# Patient Record
Sex: Female | Born: 1950
Health system: Southern US, Community
[De-identification: ages and names within clinical notes are randomized; demographics above are authoritative.]

## PROBLEM LIST (undated history)

## (undated) DIAGNOSIS — D45 Polycythemia vera: Secondary | ICD-10-CM

## (undated) DIAGNOSIS — I999 Unspecified disorder of circulatory system: Secondary | ICD-10-CM

## (undated) DIAGNOSIS — E785 Hyperlipidemia, unspecified: Secondary | ICD-10-CM

## (undated) DIAGNOSIS — F329 Major depressive disorder, single episode, unspecified: Secondary | ICD-10-CM

## (undated) DIAGNOSIS — D696 Thrombocytopenia, unspecified: Secondary | ICD-10-CM

## (undated) DIAGNOSIS — I10 Essential (primary) hypertension: Secondary | ICD-10-CM

## (undated) DIAGNOSIS — F32A Depression, unspecified: Secondary | ICD-10-CM

## (undated) DIAGNOSIS — I739 Peripheral vascular disease, unspecified: Secondary | ICD-10-CM

## (undated) DIAGNOSIS — F5101 Primary insomnia: Secondary | ICD-10-CM

## (undated) DIAGNOSIS — F419 Anxiety disorder, unspecified: Secondary | ICD-10-CM

## (undated) DIAGNOSIS — D0359 Melanoma in situ of other part of trunk: Secondary | ICD-10-CM

## (undated) DIAGNOSIS — J101 Influenza due to other identified influenza virus with other respiratory manifestations: Secondary | ICD-10-CM

## (undated) DIAGNOSIS — F1010 Alcohol abuse, uncomplicated: Secondary | ICD-10-CM

## (undated) DIAGNOSIS — J449 Chronic obstructive pulmonary disease, unspecified: Secondary | ICD-10-CM

## (undated) DIAGNOSIS — I779 Disorder of arteries and arterioles, unspecified: Secondary | ICD-10-CM

## (undated) DIAGNOSIS — J9611 Chronic respiratory failure with hypoxia: Secondary | ICD-10-CM

## (undated) HISTORY — DX: Influenza due to other identified influenza virus with other respiratory manifestations: J10.1

## (undated) HISTORY — PX: TONSILLECTOMY: SUR1361

## (undated) HISTORY — DX: Polycythemia vera: D45

## (undated) HISTORY — DX: Primary insomnia: F51.01

## (undated) HISTORY — DX: Disorder of arteries and arterioles, unspecified: I77.9

## (undated) HISTORY — DX: Thrombocytopenia, unspecified: D69.6

## (undated) HISTORY — PX: ABDOMINAL HYSTERECTOMY: SHX81

## (undated) HISTORY — DX: Major depressive disorder, single episode, unspecified: F32.9

## (undated) HISTORY — DX: Chronic respiratory failure with hypoxia: J96.11

## (undated) HISTORY — PX: OTHER SURGICAL HISTORY: SHX169

## (undated) HISTORY — DX: Melanoma in situ of other part of trunk: D03.59

## (undated) HISTORY — DX: Unspecified disorder of circulatory system: I99.9

## (undated) HISTORY — DX: Peripheral vascular disease, unspecified: I73.9

## (undated) HISTORY — DX: Chronic obstructive pulmonary disease, unspecified: J44.9

## (undated) HISTORY — DX: Hyperlipidemia, unspecified: E78.5

## (undated) HISTORY — DX: Anxiety disorder, unspecified: F41.9

## (undated) HISTORY — DX: Alcohol abuse, uncomplicated: F10.10

## (undated) HISTORY — DX: Depression, unspecified: F32.A

## (undated) HISTORY — DX: Essential (primary) hypertension: I10

---

## 2013-12-04 DIAGNOSIS — F329 Major depressive disorder, single episode, unspecified: Secondary | ICD-10-CM | POA: Insufficient documentation

## 2013-12-04 DIAGNOSIS — F32A Depression, unspecified: Secondary | ICD-10-CM | POA: Insufficient documentation

## 2013-12-04 DIAGNOSIS — F1020 Alcohol dependence, uncomplicated: Secondary | ICD-10-CM | POA: Insufficient documentation

## 2013-12-04 DIAGNOSIS — D696 Thrombocytopenia, unspecified: Secondary | ICD-10-CM | POA: Insufficient documentation

## 2013-12-04 DIAGNOSIS — F17201 Nicotine dependence, unspecified, in remission: Secondary | ICD-10-CM | POA: Insufficient documentation

## 2015-07-08 ENCOUNTER — Other Ambulatory Visit: Payer: Self-pay | Admitting: Internal Medicine

## 2015-07-08 ENCOUNTER — Encounter: Payer: Self-pay | Admitting: Internal Medicine

## 2015-07-08 DIAGNOSIS — F411 Generalized anxiety disorder: Secondary | ICD-10-CM | POA: Insufficient documentation

## 2015-07-08 DIAGNOSIS — J439 Emphysema, unspecified: Secondary | ICD-10-CM | POA: Insufficient documentation

## 2015-07-08 DIAGNOSIS — E559 Vitamin D deficiency, unspecified: Secondary | ICD-10-CM | POA: Insufficient documentation

## 2015-07-08 DIAGNOSIS — F5101 Primary insomnia: Secondary | ICD-10-CM | POA: Insufficient documentation

## 2015-07-08 DIAGNOSIS — E785 Hyperlipidemia, unspecified: Secondary | ICD-10-CM | POA: Insufficient documentation

## 2015-07-08 DIAGNOSIS — F324 Major depressive disorder, single episode, in partial remission: Secondary | ICD-10-CM | POA: Insufficient documentation

## 2015-07-08 DIAGNOSIS — I739 Peripheral vascular disease, unspecified: Secondary | ICD-10-CM | POA: Insufficient documentation

## 2015-07-08 DIAGNOSIS — D45 Polycythemia vera: Secondary | ICD-10-CM | POA: Insufficient documentation

## 2015-07-08 DIAGNOSIS — I10 Essential (primary) hypertension: Secondary | ICD-10-CM | POA: Insufficient documentation

## 2015-07-08 DIAGNOSIS — F1010 Alcohol abuse, uncomplicated: Secondary | ICD-10-CM | POA: Insufficient documentation

## 2015-07-08 DIAGNOSIS — R739 Hyperglycemia, unspecified: Secondary | ICD-10-CM | POA: Insufficient documentation

## 2015-07-08 DIAGNOSIS — Z85820 Personal history of malignant melanoma of skin: Secondary | ICD-10-CM | POA: Insufficient documentation

## 2015-07-10 DIAGNOSIS — E871 Hypo-osmolality and hyponatremia: Secondary | ICD-10-CM | POA: Insufficient documentation

## 2015-07-10 DIAGNOSIS — J9611 Chronic respiratory failure with hypoxia: Secondary | ICD-10-CM | POA: Insufficient documentation

## 2015-08-07 ENCOUNTER — Other Ambulatory Visit: Payer: Self-pay | Admitting: Internal Medicine

## 2015-08-07 ENCOUNTER — Ambulatory Visit: Payer: Self-pay | Admitting: Internal Medicine

## 2015-08-07 DIAGNOSIS — I779 Disorder of arteries and arterioles, unspecified: Secondary | ICD-10-CM | POA: Insufficient documentation

## 2015-08-07 DIAGNOSIS — Z8669 Personal history of other diseases of the nervous system and sense organs: Secondary | ICD-10-CM

## 2015-08-09 ENCOUNTER — Encounter: Payer: Self-pay | Admitting: Internal Medicine

## 2015-08-09 ENCOUNTER — Other Ambulatory Visit: Payer: Self-pay | Admitting: Internal Medicine

## 2015-08-09 ENCOUNTER — Ambulatory Visit (INDEPENDENT_AMBULATORY_CARE_PROVIDER_SITE_OTHER): Payer: Self-pay | Admitting: Internal Medicine

## 2015-08-09 VITALS — BP 168/82 | HR 80 | Ht 65.0 in | Wt 145.6 lb

## 2015-08-09 DIAGNOSIS — E785 Hyperlipidemia, unspecified: Secondary | ICD-10-CM

## 2015-08-09 DIAGNOSIS — D45 Polycythemia vera: Secondary | ICD-10-CM

## 2015-08-09 DIAGNOSIS — I739 Peripheral vascular disease, unspecified: Secondary | ICD-10-CM

## 2015-08-09 DIAGNOSIS — Z8669 Personal history of other diseases of the nervous system and sense organs: Secondary | ICD-10-CM

## 2015-08-09 DIAGNOSIS — R0989 Other specified symptoms and signs involving the circulatory and respiratory systems: Secondary | ICD-10-CM

## 2015-08-09 MED ORDER — ATORVASTATIN CALCIUM 10 MG PO TABS
10.0000 mg | ORAL_TABLET | Freq: Every day | ORAL | Status: DC
Start: 2015-08-09 — End: 2015-08-24

## 2015-08-09 NOTE — Progress Notes (Signed)
Date:  08/09/2015   Name:  Sandra Daniel   DOB:  07-05-1951   MRN:  [id number]   Chief Complaint: Eye Problem  Patient presents to follow-up from vision changes. Of note she complained of transient vision changes as long ago as 6 months. She was evaluated by hematology for polycythemia vera. She reported the vision change at that time and they suggested she might benefit from therapeutic phlebotomy. Most recently she was seen by ophthalmology and they did an evaluation and recommended that she follow-up with me for further testing for Amaurosis Fugax. She had a lipid panel 6 months ago which showed an HDL of 92. 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 She has gained some weight and does continue to drink beer on a daily basis. She is currently without insurance coverage but will be getting Medicare in March.   Review of Systems  Constitutional: Positive for unexpected weight change. Negative for chills and diaphoresis.  HENT: Negative for hearing loss.   Eyes: Positive for visual disturbance (none in several months).  Respiratory: Positive for shortness of breath (improving since quitting cigarettes). Negative for cough and wheezing.   Cardiovascular: Negative for chest pain, palpitations and leg swelling.  Gastrointestinal: Negative for abdominal pain.  Musculoskeletal: Positive for gait problem (leg discomfort and heaviness with exertion).  Skin: Positive for color change.  Neurological: Negative for tremors, syncope, light-headedness and headaches.  Psychiatric/Behavioral: Negative for sleep disturbance and dysphoric mood.    Patient Active Problem List   Diagnosis Date Noted  . Peripheral arterial occlusive disease ([place]) 08/07/2015  . H/O amaurosis fugax 08/07/2015  . Below normal amount of sodium in the blood 07/10/2015  . Chronic respiratory failure with hypoxia ([place]) 07/10/2015  . AA (alcohol abuse) 07/08/2015  . Anxiety attack 07/08/2015  . Essential (primary)  hypertension 07/08/2015  . Blood glucose elevated 07/08/2015  . HLD (hyperlipidemia) 07/08/2015  . Depression, major, single episode, in partial remission ([place]) 07/08/2015  . H/O Malignant melanoma 07/08/2015  . Vascular disorder of extremity ([place]) 07/08/2015  . Polycythemia vera ([place]) 07/08/2015  . Idiopathic insomnia 07/08/2015  . Chronic obstructive pulmonary emphysema ([place]) 07/08/2015  . Avitaminosis D 07/08/2015  . Thrombocytopenia ([place]) 12/04/2013    Prior to Admission medications   Medication Sig Start Date End Date Taking? Authorizing Provider  albuterol (PROAIR HFA) 108 (90 BASE) MCG/ACT inhaler Inhale 2 puffs into the lungs 4 (four) times daily as needed.   Yes Historical Provider, MD  ALPRAZolam Duanne Moron) 0.5 MG tablet Take 1 tablet by mouth 2 (two) times daily as needed. 11/21/14  Yes Historical Provider, MD  amLODipine-benazepril (LOTREL) 5-10 MG per capsule 1 CAPSULE BY MOUTH DAILY 07/08/15  Yes Sandra Hess, MD  budesonide-formoterol [facility]) 160-4.5 MCG/ACT inhaler Inhale 2 puffs into the lungs 2 (two) times daily. 07/12/15 07/11/16 Yes Historical Provider, MD  metoprolol succinate (TOPROL-XL) 50 MG 24 hr tablet Take 1 tablet by mouth daily. 11/21/14  Yes Historical Provider, MD  mirtazapine (REMERON) 15 MG tablet Take 1 tablet by mouth at bedtime. 11/21/14  Yes Historical Provider, MD  PARoxetine (PAXIL) 30 MG tablet Take 1 tablet by mouth daily. 11/21/14  Yes Historical Provider, MD    Allergies  Allergen Reactions  . Lorazepam Anxiety    Past Surgical History  Procedure Laterality Date  . Abdominal hysterectomy    . Tonsillectomy    . Lumbar back surgery      Social History  Substance Use Topics  . Smoking status:  Former Smoker    Quit date: 07/08/2015  . Smokeless tobacco: None  . Alcohol Use: 0.0 oz/week    0 Standard drinks or equivalent per week     Comment: 12 beers per day     Medication list has been reviewed and updated.  Physical  Examination:  Physical Exam  Constitutional: She appears well-developed and well-nourished.  Neck: Neck supple. Carotid bruit is present. No thyroid mass and no thyromegaly present.    Cardiovascular: Normal rate, regular rhythm and normal heart sounds.   Pulses:      Posterior tibial pulses are 1+ on the right side, and 1+ on the left side.  Pulmonary/Chest: Effort normal. She has decreased breath sounds. She has no wheezes. She has no rhonchi.  Lymphadenopathy:    She has no cervical adenopathy.  Psychiatric: She has a normal mood and affect.  Nursing note and vitals reviewed.   BP 168/82 mmHg  Pulse 80  Ht 5\' 5"  (1.651 m)  Wt 145 lb 9.6 oz (66.044 kg)  BMI 24.23 kg/m2  Assessment and Plan: 1. Right carotid bruit Begin atorvastatin We will order carotid ultrasound in March after patient obtains Medicare coverage - atorvastatin (LIPITOR) 10 MG tablet; Take 1 tablet (10 mg total) by mouth daily.  Dispense: 30 tablet; Refill: 5  2. HLD (hyperlipidemia) - atorvastatin (LIPITOR) 10 MG tablet; Take 1 tablet (10 mg total) by mouth daily.  Dispense: 30 tablet; Refill: 5  3. H/O amaurosis fugax Suspect symptoms are secondary to polycythemia vera If symptoms recur I recommend follow-up with hematology for therapeutic phlebotomy Nonfasting blood sugar 121  4. Polycythemia vera ([place])   5. Vascular disorder of extremity ([place]) Lipitor disorder may improve with ongoing smoking cessation Reevaluate at follow-up for possible referral   Sandra Maidens, MD [facility]  08/09/2015

## 2015-08-23 ENCOUNTER — Telehealth: Payer: Self-pay

## 2015-08-23 NOTE — Telephone Encounter (Signed)
Patient called, Atorvastatin making her legs weak, muscle cramps, can't walk to mailbox anymore, is going to stop taking it. Just wanted you to know and see if you wanted her to take anything else.dr

## 2015-08-24 MED ORDER — PRAVASTATIN SODIUM 20 MG PO TABS: 20.0000 mg | ORAL_TABLET | Freq: Every day | ORAL | Status: DC

## 2015-08-24 NOTE — Telephone Encounter (Signed)
It is important that we find a cholesterol lowering medication that she can take.  We will try pravachol 20 mg per day.  Rx sent to pharmacy.

## 2015-08-26 NOTE — Telephone Encounter (Signed)
Patient informed.dr 

## 2015-11-26 ENCOUNTER — Ambulatory Visit: Payer: Self-pay | Admitting: Internal Medicine

## 2015-12-02 ENCOUNTER — Other Ambulatory Visit: Payer: Self-pay | Admitting: Internal Medicine

## 2016-01-09 ENCOUNTER — Encounter: Payer: Self-pay | Admitting: Family Medicine

## 2016-01-17 DIAGNOSIS — J101 Influenza due to other identified influenza virus with other respiratory manifestations: Secondary | ICD-10-CM

## 2016-01-17 HISTORY — DX: Influenza due to other identified influenza virus with other respiratory manifestations: J10.1

## 2016-01-20 DIAGNOSIS — I10 Essential (primary) hypertension: Secondary | ICD-10-CM | POA: Diagnosis not present

## 2016-01-20 DIAGNOSIS — J9600 Acute respiratory failure, unspecified whether with hypoxia or hypercapnia: Secondary | ICD-10-CM | POA: Insufficient documentation

## 2016-01-20 DIAGNOSIS — R05 Cough: Secondary | ICD-10-CM | POA: Diagnosis not present

## 2016-01-20 DIAGNOSIS — J101 Influenza due to other identified influenza virus with other respiratory manifestations: Secondary | ICD-10-CM | POA: Insufficient documentation

## 2016-01-20 DIAGNOSIS — J441 Chronic obstructive pulmonary disease with (acute) exacerbation: Secondary | ICD-10-CM | POA: Diagnosis not present

## 2016-01-20 DIAGNOSIS — R6889 Other general symptoms and signs: Secondary | ICD-10-CM | POA: Diagnosis not present

## 2016-01-20 DIAGNOSIS — R0600 Dyspnea, unspecified: Secondary | ICD-10-CM | POA: Diagnosis not present

## 2016-01-20 DIAGNOSIS — R0602 Shortness of breath: Secondary | ICD-10-CM | POA: Diagnosis not present

## 2016-01-30 ENCOUNTER — Ambulatory Visit (INDEPENDENT_AMBULATORY_CARE_PROVIDER_SITE_OTHER): Payer: Medicare Other | Admitting: Internal Medicine

## 2016-01-30 ENCOUNTER — Encounter: Payer: Self-pay | Admitting: Internal Medicine

## 2016-01-30 ENCOUNTER — Other Ambulatory Visit: Payer: Self-pay | Admitting: Internal Medicine

## 2016-01-30 VITALS — BP 132/86 | HR 64 | Ht 65.0 in | Wt 151.6 lb

## 2016-01-30 DIAGNOSIS — I779 Disorder of arteries and arterioles, unspecified: Secondary | ICD-10-CM

## 2016-01-30 DIAGNOSIS — J101 Influenza due to other identified influenza virus with other respiratory manifestations: Secondary | ICD-10-CM

## 2016-01-30 DIAGNOSIS — J441 Chronic obstructive pulmonary disease with (acute) exacerbation: Secondary | ICD-10-CM

## 2016-01-30 DIAGNOSIS — D45 Polycythemia vera: Secondary | ICD-10-CM | POA: Diagnosis not present

## 2016-01-30 DIAGNOSIS — F324 Major depressive disorder, single episode, in partial remission: Secondary | ICD-10-CM

## 2016-01-30 DIAGNOSIS — I10 Essential (primary) hypertension: Secondary | ICD-10-CM | POA: Diagnosis not present

## 2016-01-30 DIAGNOSIS — L84 Corns and callosities: Secondary | ICD-10-CM | POA: Diagnosis not present

## 2016-01-30 MED ORDER — METOPROLOL SUCCINATE ER 50 MG PO TB24: 50.0000 mg | ORAL_TABLET | Freq: Every day | ORAL | Status: DC

## 2016-01-30 MED ORDER — ALBUTEROL SULFATE HFA 108 (90 BASE) MCG/ACT IN AERS: 2.0000 | INHALATION_SPRAY | Freq: Four times a day (QID) | RESPIRATORY_TRACT | Status: DC | PRN

## 2016-01-30 MED ORDER — PAROXETINE HCL 30 MG PO TABS: 30.0000 mg | ORAL_TABLET | Freq: Every day | ORAL | Status: DC

## 2016-01-30 MED ORDER — BUDESONIDE-FORMOTEROL FUMARATE 160-4.5 MCG/ACT IN AERO: 2.0000 | INHALATION_SPRAY | Freq: Two times a day (BID) | RESPIRATORY_TRACT | Status: DC

## 2016-01-30 MED ORDER — AMLODIPINE BESY-BENAZEPRIL HCL 5-10 MG PO CAPS: 1.0000 | ORAL_CAPSULE | Freq: Every day | ORAL | Status: DC

## 2016-01-30 MED ORDER — ALPRAZOLAM 0.5 MG PO TABS: 0.5000 mg | ORAL_TABLET | Freq: Two times a day (BID) | ORAL | Status: DC

## 2016-01-30 NOTE — Progress Notes (Signed)
Date:  01/30/2016   Name:  Sandra Daniel   DOB:  12-24-1950   MRN:  [id number]   Chief Complaint: Hospitalization Follow-up and Hypoxia Hypertension This is a chronic problem. The current episode started more than 1 year ago. The problem is unchanged. The problem is controlled. Associated symptoms include shortness of breath. Pertinent negatives include no chest pain or palpitations.   The patient was hospitalized at [facility] with COPD exacerbation and influenza A on 01/20/2016 and discharged on 01/23/2069. She was also sent home on oxygen therapy for low O2 saturations.  She was prescribed a prednisone taper and Tessalon Perles for cough.  She still has one more week of prednisone taper. She reports using the oxygen when she is sitting but not when she is active nor during sleep. She appears here today without the oxygen. Overall she feels much better; she's breathing more easily than she has in a long time. She remains tobacco free; no cigarettes whatsoever for the past 3 weeks.  Polycythemia vera - patient was evaluated several years ago for polycythemia. Hematologist apparently recommended phlebotomy but she was unable to afford follow-up visits. Now she has Medicare and would like to be referred to another hematologist.  Toe Wound - Patient complains of what appeared to be callus or thickening skin on the tip of second and third toes on her left foot. She peeled off and now the area is very tender. The second toe there is actually a small area that looks like it might have bled.  Chronic daily alcohol use - patient continues to consume 10-12 beers per day. She denies history of DTs. However her prehospital course reveals some evidence of delirium which could have been secondary to either hypoxia and influenza versus alcohol withdrawal.  Depression - patient is doing well on current therapy.  She denies mood disorder.  She takes alprazolam and remeron at night for sleep.  She is  still on Paxil without side effects.  Review of Systems  Constitutional: Positive for fatigue. Negative for fever.  HENT: Negative for sore throat, tinnitus and trouble swallowing.   Eyes: Negative for visual disturbance.  Respiratory: Positive for shortness of breath. Negative for choking, chest tightness and wheezing.   Cardiovascular: Negative for chest pain, palpitations and leg swelling.  Gastrointestinal: Negative for abdominal pain, diarrhea and constipation.  Genitourinary: Negative for difficulty urinating.  Musculoskeletal: Negative for back pain and arthralgias.  Skin: Positive for wound (second and third toes on left ).  Neurological: Negative for dizziness and syncope.  Hematological: Negative for adenopathy.  Psychiatric/Behavioral: Negative for sleep disturbance and dysphoric mood.    Patient Active Problem List   Diagnosis Date Noted  . Influenza due to influenza A virus 01/20/2016  . Acute respiratory failure ([place]) 01/20/2016  . Chronic obstructive pulmonary disease with acute exacerbation ([place]) 01/20/2016  . Peripheral arterial occlusive disease ([place]) 08/07/2015  . H/O amaurosis fugax 08/07/2015  . Below normal amount of sodium in the blood 07/10/2015  . Chronic respiratory failure with hypoxia ([place]) 07/10/2015  . AA (alcohol abuse) 07/08/2015  . Anxiety attack 07/08/2015  . Essential (primary) hypertension 07/08/2015  . Blood glucose elevated 07/08/2015  . HLD (hyperlipidemia) 07/08/2015  . Depression, major, single episode, in partial remission ([place]) 07/08/2015  . H/O Malignant melanoma 07/08/2015  . Vascular disorder of extremity ([place]) 07/08/2015  . Polycythemia vera ([place]) 07/08/2015  . Idiopathic insomnia 07/08/2015  . Chronic obstructive pulmonary emphysema ([place]) 07/08/2015  . Avitaminosis  D 07/08/2015  . Thrombocytopenia ([place]) 12/04/2013    Prior to Admission medications   Medication Sig Start Date End Date Taking? Authorizing Provider  albuterol  (PROAIR HFA) 108 (90 BASE) MCG/ACT inhaler Inhale 2 puffs into the lungs 4 (four) times daily as needed.   Yes Historical Provider, MD  ALPRAZolam Duanne Moron) 0.5 MG tablet TAKE 1 TABLET BY MOUTH 2 TIMES DAILY 08/09/15  Yes Glean Hess, MD  amLODipine-benazepril (LOTREL) 5-10 MG per capsule 1 CAPSULE BY MOUTH DAILY 07/08/15  Yes Glean Hess, MD  benzonatate (TESSALON) 100 MG capsule Take by mouth. 01/24/16 01/31/16 Yes Historical Provider, MD  budesonide-formoterol (SYMBICORT) 160-4.5 MCG/ACT inhaler Inhale 2 puffs into the lungs 2 (two) times daily. 07/12/15 07/11/16 Yes Historical Provider, MD  metoprolol succinate (TOPROL-XL) 50 MG 24 hr tablet TAKE 1 TABLET BY MOUTH DAILY 08/09/15  Yes Glean Hess, MD  mirtazapine (REMERON) 15 MG tablet TAKE 1 TABLET BY MOUTH AT BEDTIME 12/02/15  Yes Glean Hess, MD  PARoxetine (PAXIL) 30 MG tablet Take 1 tablet by mouth daily. 11/21/14  Yes Historical Provider, MD  predniSONE (DELTASONE) 10 MG tablet  01/24/16  Yes Historical Provider, MD    Allergies  Allergen Reactions  . Atorvastatin     myalgia  . Lorazepam Anxiety    Past Surgical History  Procedure Laterality Date  . Abdominal hysterectomy    . Tonsillectomy    . Lumbar back surgery      Social History  Substance Use Topics  . Smoking status: Former Smoker    Quit date: 07/08/2015  . Smokeless tobacco: None  . Alcohol Use: 0.0 oz/week    0 Standard drinks or equivalent per week     Comment: 12 beers per day    Medication list has been reviewed and updated.   Physical Exam  Constitutional: She is oriented to person, place, and time. She appears well-developed and well-nourished.  HENT:  Head: Normocephalic and atraumatic.  Neck: Carotid bruit is not present. No thyromegaly present.  Cardiovascular: Normal rate, regular rhythm, S1 normal and normal heart sounds.   Pulses:      Carotid pulses are 1+ on the right side, and 1+ on the left side.      Radial pulses are 2+ on  the right side, and 2+ on the left side.       Dorsalis pedis pulses are 1+ on the right side, and 1+ on the left side.       Posterior tibial pulses are 0 on the right side, and 0 on the left side.  Pulmonary/Chest: Effort normal. She has decreased breath sounds. She has no wheezes. She has no rhonchi. She has no rales.  Lymphadenopathy:    She has no cervical adenopathy.  Neurological: She is alert and oriented to person, place, and time. No cranial nerve deficit or sensory deficit.  Skin:  Dorsal tip of 2nd toe - intact skin pink with peeling thickened skin surrounding ~0.5 cm Dorsal tip of 3rd toe - thickened skin with central dried blood (pinpoint) ~0.5 cm All nails thickened and dystrophic  Nursing note and vitals reviewed.   BP 132/86 mmHg  Pulse 64  Ht 5\' 5"  (1.651 m)  Wt 151 lb 9.6 oz (68.765 kg)  BMI 25.23 kg/m2  SpO2 88%  Assessment and Plan: 1. Chronic obstructive pulmonary disease with acute exacerbation (HCC) Patient encouraged to use O2 when active rather than at rest - albuterol (PROAIR HFA) 108 (90 Base) MCG/ACT inhaler;  Inhale 2 puffs into the lungs 4 (four) times daily as needed.  Dispense: 18 g; Refill: 5 - budesonide-formoterol (SYMBICORT) 160-4.5 MCG/ACT inhaler; Inhale 2 puffs into the lungs 2 (two) times daily.  Dispense: 1 Inhaler; Refill: 5  2. Polycythemia vera ([place]) - Ambulatory referral to Hematology  3. Influenza due to influenza A virus resolved  4. Essential (primary) hypertension controlled - amLODipine-benazepril (LOTREL) 5-10 MG capsule; Take 1 capsule by mouth daily.  Dispense: 30 capsule; Refill: 5 - metoprolol succinate (TOPROL-XL) 50 MG 24 hr tablet; Take 1 tablet (50 mg total) by mouth daily. Take with or immediately following a meal.  Dispense: 30 tablet; Refill: 5  5. Depression, major, single episode, in partial remission ([place]) Doing well on current medication - ALPRAZolam (XANAX) 0.5 MG tablet; Take 1 tablet (0.5 mg total) by mouth 2  (two) times daily.  Dispense: 60 tablet; Refill: 5 - PARoxetine (PAXIL) 30 MG tablet; Take 1 tablet (30 mg total) by mouth daily.  Dispense: 30 tablet; Refill: 5  6. Peripheral arterial occlusive disease (HCC) Stable but with non-healing toe lesions  7. Pre-ulcerative corn or callous Recommend Podiatry consultation for toenails and toe callouses   Halina Maidens, MD [facility]  01/30/2016

## 2016-01-30 NOTE — Patient Instructions (Signed)
DASH Eating Plan  DASH stands for "Dietary Approaches to Stop Hypertension." The DASH eating plan is a healthy eating plan that has been shown to reduce high blood pressure (hypertension). Additional health benefits may include reducing the risk of type 2 diabetes mellitus, heart disease, and stroke. The DASH eating plan may also help with weight loss.  WHAT DO I NEED TO KNOW ABOUT THE DASH EATING PLAN?  For the DASH eating plan, you will follow these general guidelines:  · Choose foods with a percent daily value for sodium of less than 5% (as listed on the food label).  · Use salt-free seasonings or herbs instead of table salt or sea salt.  · Check with your health care provider or pharmacist before using salt substitutes.  · Eat lower-sodium products, often labeled as "lower sodium" or "no salt added."  · Eat fresh foods.  · Eat more vegetables, fruits, and low-fat dairy products.  · Choose whole grains. Look for the word "whole" as the first word in the ingredient list.  · Choose fish and skinless chicken or turkey more often than red meat. Limit fish, poultry, and meat to 6 oz (170 g) each day.  · Limit sweets, desserts, sugars, and sugary drinks.  · Choose heart-healthy fats.  · Limit cheese to 1 oz (28 g) per day.  · Eat more home-cooked food and less restaurant, buffet, and fast food.  · Limit fried foods.  · Cook foods using methods other than frying.  · Limit canned vegetables. If you do use them, rinse them well to decrease the sodium.  · When eating at a restaurant, ask that your food be prepared with less salt, or no salt if possible.  WHAT FOODS CAN I EAT?  Seek help from a dietitian for individual calorie needs.  Grains  Whole grain or whole wheat bread. Brown rice. Whole grain or whole wheat pasta. Quinoa, bulgur, and whole grain cereals. Low-sodium cereals. Corn or whole wheat flour tortillas. Whole grain cornbread. Whole grain crackers. Low-sodium crackers.  Vegetables  Fresh or frozen vegetables  (raw, steamed, roasted, or grilled). Low-sodium or reduced-sodium tomato and vegetable juices. Low-sodium or reduced-sodium tomato sauce and paste. Low-sodium or reduced-sodium canned vegetables.   Fruits  All fresh, canned (in natural juice), or frozen fruits.  Meat and Other Protein Products  Ground beef (85% or leaner), grass-fed beef, or beef trimmed of fat. Skinless chicken or turkey. Ground chicken or turkey. Pork trimmed of fat. All fish and seafood. Eggs. Dried beans, peas, or lentils. Unsalted nuts and seeds. Unsalted canned beans.  Dairy  Low-fat dairy products, such as skim or 1% milk, 2% or reduced-fat cheeses, low-fat ricotta or cottage cheese, or plain low-fat yogurt. Low-sodium or reduced-sodium cheeses.  Fats and Oils  Tub margarines without trans fats. Light or reduced-fat mayonnaise and salad dressings (reduced sodium). Avocado. Safflower, olive, or canola oils. Natural peanut or almond butter.  Other  Unsalted popcorn and pretzels.  The items listed above may not be a complete list of recommended foods or beverages. Contact your dietitian for more options.  WHAT FOODS ARE NOT RECOMMENDED?  Grains  White bread. White pasta. White rice. Refined cornbread. Bagels and croissants. Crackers that contain trans fat.  Vegetables  Creamed or fried vegetables. Vegetables in a cheese sauce. Regular canned vegetables. Regular canned tomato sauce and paste. Regular tomato and vegetable juices.  Fruits  Dried fruits. Canned fruit in light or heavy syrup. Fruit juice.  Meat and Other Protein   Products  Fatty cuts of meat. Ribs, chicken wings, bacon, sausage, bologna, salami, chitterlings, fatback, hot dogs, bratwurst, and packaged luncheon meats. Salted nuts and seeds. Canned beans with salt.  Dairy  Whole or 2% milk, cream, half-and-half, and cream cheese. Whole-fat or sweetened yogurt. Full-fat cheeses or blue cheese. Nondairy creamers and whipped toppings. Processed cheese, cheese spreads, or cheese  curds.  Condiments  Onion and garlic salt, seasoned salt, table salt, and sea salt. Canned and packaged gravies. Worcestershire sauce. Tartar sauce. Barbecue sauce. Teriyaki sauce. Soy sauce, including reduced sodium. Steak sauce. Fish sauce. Oyster sauce. Cocktail sauce. Horseradish. Ketchup and mustard. Meat flavorings and tenderizers. Bouillon cubes. Hot sauce. Tabasco sauce. Marinades. Taco seasonings. Relishes.  Fats and Oils  Butter, stick margarine, lard, shortening, ghee, and bacon fat. Coconut, palm kernel, or palm oils. Regular salad dressings.  Other  Pickles and olives. Salted popcorn and pretzels.  The items listed above may not be a complete list of foods and beverages to avoid. Contact your dietitian for more information.  WHERE CAN I FIND MORE INFORMATION?  National Heart, Lung, and Blood Institute: www.nhlbi.nih.gov/health/health-topics/topics/dash/     This information is not intended to replace advice given to you by your health care provider. Make sure you discuss any questions you have with your health care provider.     Document Released: 10/01/2011 Document Revised: 11/02/2014 Document Reviewed: 08/16/2013  Elsevier Interactive Patient Education ©2016 [employer]

## 2016-02-13 ENCOUNTER — Encounter: Payer: Self-pay | Admitting: Internal Medicine

## 2016-03-23 ENCOUNTER — Other Ambulatory Visit: Payer: Self-pay | Admitting: Internal Medicine

## 2016-03-30 ENCOUNTER — Ambulatory Visit: Payer: Medicare Other | Admitting: Internal Medicine

## 2016-04-08 ENCOUNTER — Inpatient Hospital Stay: Payer: Medicare HMO

## 2016-04-08 ENCOUNTER — Inpatient Hospital Stay: Payer: Medicare HMO | Attending: Hematology and Oncology | Admitting: Hematology and Oncology

## 2016-04-08 ENCOUNTER — Encounter: Payer: Self-pay | Admitting: Hematology and Oncology

## 2016-04-08 VITALS — BP 133/79 | HR 78 | Temp 99.9°F | Resp 20 | Ht 65.0 in | Wt 158.3 lb

## 2016-04-08 DIAGNOSIS — F419 Anxiety disorder, unspecified: Secondary | ICD-10-CM | POA: Diagnosis not present

## 2016-04-08 DIAGNOSIS — I739 Peripheral vascular disease, unspecified: Secondary | ICD-10-CM | POA: Insufficient documentation

## 2016-04-08 DIAGNOSIS — D7589 Other specified diseases of blood and blood-forming organs: Secondary | ICD-10-CM | POA: Insufficient documentation

## 2016-04-08 DIAGNOSIS — D45 Polycythemia vera: Secondary | ICD-10-CM

## 2016-04-08 DIAGNOSIS — R0902 Hypoxemia: Secondary | ICD-10-CM | POA: Diagnosis not present

## 2016-04-08 DIAGNOSIS — Z79899 Other long term (current) drug therapy: Secondary | ICD-10-CM | POA: Diagnosis not present

## 2016-04-08 DIAGNOSIS — Z87891 Personal history of nicotine dependence: Secondary | ICD-10-CM | POA: Insufficient documentation

## 2016-04-08 DIAGNOSIS — I10 Essential (primary) hypertension: Secondary | ICD-10-CM | POA: Diagnosis not present

## 2016-04-08 DIAGNOSIS — E785 Hyperlipidemia, unspecified: Secondary | ICD-10-CM | POA: Diagnosis not present

## 2016-04-08 DIAGNOSIS — D751 Secondary polycythemia: Secondary | ICD-10-CM | POA: Insufficient documentation

## 2016-04-08 DIAGNOSIS — Z85820 Personal history of malignant melanoma of skin: Secondary | ICD-10-CM

## 2016-04-08 DIAGNOSIS — J449 Chronic obstructive pulmonary disease, unspecified: Secondary | ICD-10-CM | POA: Insufficient documentation

## 2016-04-08 DIAGNOSIS — F329 Major depressive disorder, single episode, unspecified: Secondary | ICD-10-CM | POA: Diagnosis not present

## 2016-04-08 LAB — CBC WITH DIFFERENTIAL/PLATELET
Basophils Absolute: 0.1 10*3/uL (ref 0–0.1)
Basophils Relative: 1 %
Eosinophils Absolute: 0.3 10*3/uL (ref 0–0.7)
Eosinophils Relative: 3 %
HCT: 46.4 % (ref 35.0–47.0)
Hemoglobin: 15.6 g/dL (ref 12.0–16.0)
Lymphocytes Relative: 25 %
Lymphs Abs: 2.6 10*3/uL (ref 1.0–3.6)
MCH: 33.7 pg (ref 26.0–34.0)
MCHC: 33.7 g/dL (ref 32.0–36.0)
MCV: 100.1 fL — ABNORMAL HIGH (ref 80.0–100.0)
Monocytes Absolute: 0.8 10*3/uL (ref 0.2–0.9)
Monocytes Relative: 8 %
Neutro Abs: 6.8 10*3/uL — ABNORMAL HIGH (ref 1.4–6.5)
Neutrophils Relative %: 63 %
Platelets: 245 10*3/uL (ref 150–440)
RBC: 4.64 MIL/uL (ref 3.80–5.20)
RDW: 14.4 % (ref 11.5–14.5)
WBC: 10.5 10*3/uL (ref 3.6–11.0)

## 2016-04-08 LAB — IRON AND TIBC
Iron: 90 ug/dL (ref 28–170)
Saturation Ratios: 28 % (ref 10.4–31.8)
TIBC: 319 ug/dL (ref 250–450)
UIBC: 229 ug/dL

## 2016-04-08 LAB — URINALYSIS COMPLETE WITH MICROSCOPIC (ARMC ONLY)
Bilirubin Urine: NEGATIVE
Glucose, UA: NEGATIVE mg/dL
Hgb urine dipstick: NEGATIVE
Ketones, ur: NEGATIVE mg/dL
Leukocytes, UA: NEGATIVE
Nitrite: NEGATIVE
Protein, ur: NEGATIVE mg/dL
Specific Gravity, Urine: 1.01 (ref 1.005–1.030)
pH: 7 (ref 5.0–8.0)

## 2016-04-08 LAB — FERRITIN: Ferritin: 278 ng/mL (ref 11–307)

## 2016-04-08 LAB — HEPATIC FUNCTION PANEL
ALT: 30 U/L (ref 14–54)
AST: 27 U/L (ref 15–41)
Albumin: 4.2 g/dL (ref 3.5–5.0)
Alkaline Phosphatase: 64 U/L (ref 38–126)
Bilirubin, Direct: <0.1 mg/dL — ABNORMAL LOW (ref 0.1–0.5)
Total Bilirubin: 0.7 mg/dL (ref 0.3–1.2)
Total Protein: 7.9 g/dL (ref 6.5–8.1)

## 2016-04-08 LAB — RETICULOCYTES
RBC.: 4.58 MIL/uL (ref 3.80–5.20)
Retic Count, Absolute: 91.6 10*3/uL (ref 19.0–183.0)
Retic Ct Pct: 2 % (ref 0.4–3.1)

## 2016-04-08 LAB — FOLATE: Folate: 8.9 ng/mL (ref >5.9)

## 2016-04-08 LAB — TSH: TSH: 3.136 u[IU]/mL (ref 0.350–4.500)

## 2016-04-08 LAB — VITAMIN B12: Vitamin B-12: 309 pg/mL (ref 180–914)

## 2016-04-08 NOTE — Progress Notes (Signed)
[facility] day:  04/08/2016  Chief Complaint: Sandra Daniel is a 65 y.o. female with polycythemia who is referred by Dr. Army Melia for assessment and management.  HPI:  The patient notes a history of "too much blood" for several years.  She states that she first became aware of this problem last year.  She states that she was seen by a hematologist at [facility] in 2016.  It was recommended that she have her "blood drained" (phlebotomy).  She has not had a phlebotomy.  She notes that greater than 10 years ago, she donated blood yearly at the [facility].  She notes a history of smoking 1 - 1 1/2 packs a day from age 35 to 86 (40-50 pack year).  She quit smoking for 7 years then started smoking again.  She states that she stopped smoking on 07/09/2015, but between 06/2015 and 09/2015, she smoked 5 packs of cigarettes.    She does not see a pulmonologist.  She states that she just started using oxygen (2 - 2 1/2 liter/min via nasal cannula) this year after discharge from [facility].  She states that she was "admitted because of my lung" (? COPD flare).  She admits to using her oxygen sparingly.  CXR on 01/20/2016 revealed hyperexpansion (stable) suggestive of obstructive disease.  She is unaware of any history of sleep apnea.  She snores at night, sometimes loud enough to wake herself up.  She does not feel rested in the morning.  She has never had overnight oximetry.  She notes that her diet is not good.  She eats fast food regularly ([employer] and [employer]).  She will eat once or twice a day, typically in the afternoon.  She does not exercise.  She states that her activity consists of getting up to go to the bathroom, getting a bear, and eating.  She has been drinking since her 2s.  She typically drinks a 12 pack of beer a day.  She has never been told that she has cirrhosis.  She denies any history of delerium tremens or alcohol withdrawal.  She has had  issues with mild thrombocytopenia.    She states that she has never had a colonoscopy (and doesn't plan to ever have one).  She gets a mammogram every 3 years.  Symptomatically, she notes shortness of breath and wheezing.  She is chronically fatigued.  Weight is up 40 pounds since she stopped smoking.  She notes that she "can't see well".  She notes visual issues for 1-2 years.  She says that she has early cataracts and retinal hemorrhage.  She states that her left eye is worse than her right eye.  She states that her opthalmologist is waiting on her follow-up with hematology.  She notes paresthesias in her toes greater than her fingers.  The bottom of her feet are numb.  Her muscles ache when she walks.  She has never had a clot (DVT, PE or CVA).  She should be on a baby aspirin a day, but only takes 1 about once a week.  She has somewhat slow mentation described as "hard time getting words out" (word finding issues) and "thoughts not as sharp".  Review of available labs notes erythrocytosis since at least 08/05/2013.  At that time, hematocrit was 55, hemoglobin 18.8 and MCV 98.  Next available labs on 12/04/2013 reveals a hematocrit of 55, hemoglobin 19.2, and MCV 103 on 12/04/2013.  Hematocrit has ranged  between 50.9 - 52.3 with a hemoglobin 16.2 - 17.5 since that time.  MCV has ranged between 99 - 107.  Platelet count was 102,000 - 134,000 in 11/2013 and 12/2015.   Past Medical History  Diagnosis Date  . Melanoma in situ of back ([place])   . COPD (chronic obstructive pulmonary disease) ([place])   . Depression   . Hypertension   . Anxiety   . Thrombocytopenia ([place])   . Alcohol abuse   . Peripheral arterial occlusive disease ([place])   . Hyperlipidemia   . Vascular disorder of lower extremity     right big toe-peeling and discoloration  . Polycythemia rubra vera ([place])   . Idiopathic insomnia   . Chronic respiratory failure with hypoxia ([place])   . Influenza due to influenza A virus 01/17/2016     Past Surgical History  Procedure Laterality Date  . Abdominal hysterectomy    . Tonsillectomy    . Lumbar back surgery      Family History  Problem Relation Age of Onset  . CAD Father     age 45  . Heart attack Father   . Stroke Mother   . Stroke Paternal Aunt   . Stroke Paternal Uncle   . Stroke Paternal Grandmother   . Vascular Disease Paternal Grandfather     Social History:  reports that she quit smoking about 9 months ago. She does not have any smokeless tobacco history on file. She reports that she drinks alcohol. She reports that she does not use illicit drugs.  She smoked 1 - 1 1/2 packs a day from age 35 to 85 (40-50 pack year).  She quit smoking for 7 years then started smoking again.  She stopped smoking on 07/09/2015, but between 06/2015 and 09/2015, she smoked 5 packs of cigarettes. Her husband died 3 years ago (Apr 30, 2013).  She lives in [place], [place].  The patient is alone today.  Allergies:  Allergies  Allergen Reactions  . Atorvastatin     myalgia  . Lorazepam Anxiety    Current Medications: Current Outpatient Prescriptions  Medication Sig Dispense Refill  . albuterol (PROAIR HFA) 108 (90 Base) MCG/ACT inhaler Inhale 2 puffs into the lungs 4 (four) times daily as needed. 18 g 5  . ALPRAZolam (XANAX) 0.5 MG tablet TAKE 1 TABLET BY MOUTH 2 TIMES DAILY 60 tablet 2  . amLODipine-benazepril (LOTREL) 5-10 MG capsule Take 1 capsule by mouth daily. 30 capsule 5  . metoprolol succinate (TOPROL-XL) 50 MG 24 hr tablet Take 1 tablet (50 mg total) by mouth daily. Take with or immediately following a meal. 30 tablet 5  . mirtazapine (REMERON) 15 MG tablet TAKE 1 TABLET BY MOUTH AT BEDTIME 30 tablet 5  . PARoxetine (PAXIL) 30 MG tablet Take 1 tablet (30 mg total) by mouth daily. 30 tablet 5  . budesonide-formoterol (SYMBICORT) 160-4.5 MCG/ACT inhaler Inhale 2 puffs into the lungs 2 (two) times daily. (Patient not taking: Reported on 04/08/2016) 1 Inhaler 5   No current  facility-administered medications for this visit.    Review of Systems:  GENERAL:  Fatigue.  Minimally active.  No fevers or sweats.  Weight gain of 40 pounds in 6 months. PERFORMANCE STATUS (ECOG):  2 HEENT:  Visual changes (see HPI).  No runny nose, sore throat, mouth sores or tenderness. Lungs:  Chronic shortness of breath.  No hemoptysis. Cardiac:  No chest pain, palpitations, orthopnea, or PND. GI:  No nausea, vomiting, diarrhea, constipation, melena or hematochezia. GU:  No  urgency, frequency, dysuria, or hematuria. Musculoskeletal:  No back pain.  No joint pain.  Muscle ache when walking. Extremities:  No pain or swelling. Skin:  Left great toe peeling. Prescribed antibiotics by podiatrist, but stopped secondary to diarrhea.  Neuro:  Paresthesias in toes > fingers.  Feet numb.  Thoughts not as sharp.  No headache or weakness.  Balance issues. Endocrine:  No diabetes, thyroid issues, hot flashes or night sweats. Psych:  No mood changes, depression or anxiety. Pain:  No focal pain. Review of systems:  All other systems reviewed and found to be negative.  Physical Exam: Blood pressure 133/79, pulse 78, temperature 99.9 F (37.7 C), temperature source Tympanic, resp. rate 20, height 5\' 5"  (1.651 m), weight 158 lb 4.6 oz (71.8 kg). GENERAL:  Well developed, well nourished, sitting comfortably in the exam room in no acute distress. MENTAL STATUS:  Alert and oriented to person, place and time. HEAD:  Long brown hair with blonde highlights.  Normocephalic, atraumatic, face full and symmetric, no Cushingoid features. EYES:  Blue eyes.  Pupils equal round and reactive to light and accomodation.  Eyes injected.  No scleral icterus. ENT:  Oropharynx clear without lesion.  Upper dentures.  Tongue normal. Mucous membranes moist.  CHEST:  Barrel shaped chest. RESPIRATORY:  Expiratory wheeze on deep breathing.  No rales, wheezes or rhonchi. CARDIOVASCULAR:  Regular rate and rhythm without  murmur, rub or gallop. ABDOMEN:  Soft, slightly tender RUQ without guarding or rebound tenderness.  Liver palpable 2-3 fingerbreaths below right costal margin.  Fullness in left upper quadrant (difficult to percuss spleen).  Active bowel sounds.  No masses. SKIN:  Spider angiomas across chest.  No rashes, ulcers or lesions. EXTREMITIES:  Warm.  Slight discoloration in nailbeds.  Slightly delayed capillary refill.  No edema or tenderness.  No palpable cords. LYMPH NODES: No palpable cervical, supraclavicular, axillary or inguinal adenopathy  NEUROLOGICAL: Unremarkable. PSYCH:  Appropriate.  Appointment on 04/08/2016  Component Date Value Ref Range Status  . WBC 04/08/2016 10.5  3.6 - 11.0 K/uL Final  . RBC 04/08/2016 4.64  3.80 - 5.20 MIL/uL Final  . Hemoglobin 04/08/2016 15.6  12.0 - 16.0 g/dL Final  . HCT 04/08/2016 46.4  35.0 - 47.0 % Final  . MCV 04/08/2016 100.1* 80.0 - 100.0 fL Final  . MCH 04/08/2016 33.7  26.0 - 34.0 pg Final  . MCHC 04/08/2016 33.7  32.0 - 36.0 g/dL Final  . RDW 04/08/2016 14.4  11.5 - 14.5 % Final  . Platelets 04/08/2016 245  150 - 440 K/uL Final  . Neutrophils Relative % 04/08/2016 63%   Final  . Neutro Abs 04/08/2016 6.8* 1.4 - 6.5 K/uL Final  . Lymphocytes Relative 04/08/2016 25%   Final  . Lymphs Abs 04/08/2016 2.6  1.0 - 3.6 K/uL Final  . Monocytes Relative 04/08/2016 8%   Final  . Monocytes Absolute 04/08/2016 0.8  0.2 - 0.9 K/uL Final  . Eosinophils Relative 04/08/2016 3%   Final  . Eosinophils Absolute 04/08/2016 0.3  0 - 0.7 K/uL Final  . Basophils Relative 04/08/2016 1%   Final  . Basophils Absolute 04/08/2016 0.1  0 - 0.1 K/uL Final  . Retic Ct Pct 04/08/2016 PENDING  0.4 - 3.1 % Incomplete  . RBC. 04/08/2016 PENDING  3.87 - 5.11 MIL/uL Incomplete  . Retic Count, Manual 04/08/2016 PENDING  19.0 - 186.0 K/uL Incomplete  . Total Protein 04/08/2016 7.9  6.5 - 8.1 g/dL Final  . Albumin 04/08/2016 4.2  3.5 - 5.0 g/dL Final  . AST 04/08/2016 27  15 -  41 U/L Final  . ALT 04/08/2016 30  14 - 54 U/L Final  . Alkaline Phosphatase 04/08/2016 64  38 - 126 U/L Final  . Total Bilirubin 04/08/2016 0.7  0.3 - 1.2 mg/dL Final  . Bilirubin, Direct 04/08/2016 <0.1* 0.1 - 0.5 mg/dL Final  . Indirect Bilirubin 04/08/2016 NOT CALCULATED  0.3 - 0.9 mg/dL Final  . Color, Urine 04/08/2016 YELLOW  YELLOW Final  . APPearance 04/08/2016 CLEAR  CLEAR Final  . Glucose, UA 04/08/2016 NEGATIVE  NEGATIVE mg/dL Final  . Bilirubin Urine 04/08/2016 NEGATIVE  NEGATIVE Final  . Ketones, ur 04/08/2016 NEGATIVE  NEGATIVE mg/dL Final  . Specific Gravity, Urine 04/08/2016 1.010  1.005 - 1.030 Final  . Hgb urine dipstick 04/08/2016 NEGATIVE  NEGATIVE Final  . pH 04/08/2016 7.0  5.0 - 8.0 Final  . Protein, ur 04/08/2016 NEGATIVE  NEGATIVE mg/dL Final  . Nitrite 04/08/2016 NEGATIVE  NEGATIVE Final  . Leukocytes, UA 04/08/2016 NEGATIVE  NEGATIVE Final  . RBC / HPF 04/08/2016 0-5  0 - 5 RBC/hpf Final  . WBC, UA 04/08/2016 0-5  0 - 5 WBC/hpf Final  . Bacteria, UA 04/08/2016 FEW* NONE SEEN Final  . Squamous Epithelial / LPF 04/08/2016 0-5* NONE SEEN Final    Assessment:  Sandra Daniel is a 65 y.o. female with probable compensatory erythrocytosis due to hypoxia from COPD and smoking.  She stopped smoking in 10/2015.  She may have some component of sleep apnea.  She is on oxygen 2 -2 1/2 liters via [place] a day, but uses it sparingly.  Review of available labs notes erythrocytosis since at least 08/05/2013.  At that time, hematocrit was 55, hemoglobin 18.8 and MCV 98.  Hematocrit has ranged between 50.9 - 52.3 with a hemoglobin 16.2 - 17.5.  MCV has ranged between 99 - 107.  Platelet count was 102,000 - 134,000 in 11/2013 and 12/2015.  Her diet is poor.  She drinks 12 cans of beer a day.  She has never had a colonoscopy (refuses).  She has a mammogram every 3 years.  Symptomatically, she notes fatigue, visual changes, paresthesias, and thinking issues which may be due to a high  viscosity associated with her erythrocytosis.  She takes a baby aspirin once a week.  Plan: 1.  Discuss erythrocytosis and evaluation.  Suspect secondary erythrocytosis due to smoking.  Discuss possible sleep apnea.  Doubt erythropoietin secreting tumor (liver, kidney).  Doubt primary polycythemia rubra vera (PV).  Discuss baseline labs. 2.  Discuss consideration of phlebotomy trial (small volume 250 cc at a time) to see if current symptomatology will improve with a decreased hematocrit.  However, patient's symptoms of shortness of breath may worsen if hematocrit is normalized.   3.  Labs today:  CBC with diff, carboxyhemoglobin, epo level, ferritin, iron studies, retic, LFTs, JAK2 with reflex, B12, folate, TSH. 4.  Urinalysis- r/o hematuria. 5.  Abdominal ultrasound- assess liver, spleen, and kidneys. 6.  Encourage continued smoking cessation.  Encourage decreased alcohol intake. 7.  Encourage aspirin 81 mg a day. 8.  Consider overnight oximetry. 9.  RTC in 1 week for MD assess, labs (Hgb) +/- phlebotomy.   Lequita Asal, MD  04/08/2016, 2:15 PM

## 2016-04-08 NOTE — Progress Notes (Signed)
Pt here as new pt for PV.  She states she has had it for years, was tested and told that she does have it but nothing ever done.  She has COPD and has oxygen 2.5 liters but does not use oxygen out of her house due to tank to heavy.  I advised her to ask [employer] for a roller for the tanks.  She gets sob on little exertion. Her sats in office with no oxygen 88 and then 89%.  Patient has noticed wheezing at times when she exerts herself. She also notes a discolored area on right bit toe and peeling of skin in which she saw a podiatrist and he clipped her nails and gave her atb that caused diarrhea so she stopped taking it and never called md.  She was alsp made an appt with vascular md in [place] which she is not going to because of distance and she will ask Army Melia to set up up for one locally.  She has never had strokes or DVT's no HA.Marland Kitchen

## 2016-04-09 ENCOUNTER — Encounter: Payer: Self-pay | Admitting: Hematology and Oncology

## 2016-04-09 LAB — CARBON MONOXIDE, BLOOD (PERFORMED AT REF LAB): Carbon Monoxide, Blood: 2.4 % (ref 0.0–3.6)

## 2016-04-14 ENCOUNTER — Ambulatory Visit
Admission: RE | Admit: 2016-04-14 | Discharge: 2016-04-14 | Disposition: A | Discharge status: Home / Self Care | Payer: Medicare HMO | Source: Ambulatory Visit | Attending: Hematology and Oncology | Admitting: Hematology and Oncology

## 2016-04-14 DIAGNOSIS — K8020 Calculus of gallbladder without cholecystitis without obstruction: Secondary | ICD-10-CM | POA: Insufficient documentation

## 2016-04-14 DIAGNOSIS — D45 Polycythemia vera: Secondary | ICD-10-CM | POA: Insufficient documentation

## 2016-04-14 DIAGNOSIS — R932 Abnormal findings on diagnostic imaging of liver and biliary tract: Secondary | ICD-10-CM | POA: Diagnosis not present

## 2016-04-15 ENCOUNTER — Encounter: Payer: Self-pay | Admitting: Hematology and Oncology

## 2016-04-15 ENCOUNTER — Other Ambulatory Visit: Payer: Self-pay

## 2016-04-15 ENCOUNTER — Inpatient Hospital Stay (HOSPITAL_BASED_OUTPATIENT_CLINIC_OR_DEPARTMENT_OTHER): Payer: Medicare HMO | Admitting: Hematology and Oncology

## 2016-04-15 ENCOUNTER — Ambulatory Visit: Payer: Self-pay | Admitting: Hematology and Oncology

## 2016-04-15 ENCOUNTER — Inpatient Hospital Stay: Payer: Medicare HMO

## 2016-04-15 ENCOUNTER — Ambulatory Visit: Payer: Medicare HMO

## 2016-04-15 ENCOUNTER — Telehealth: Payer: Self-pay | Admitting: *Deleted

## 2016-04-15 VITALS — BP 123/72 | HR 80 | Temp 97.9°F | Resp 18 | Ht 65.0 in | Wt 156.6 lb

## 2016-04-15 DIAGNOSIS — D45 Polycythemia vera: Secondary | ICD-10-CM

## 2016-04-15 DIAGNOSIS — R0902 Hypoxemia: Secondary | ICD-10-CM

## 2016-04-15 DIAGNOSIS — E538 Deficiency of other specified B group vitamins: Secondary | ICD-10-CM

## 2016-04-15 DIAGNOSIS — D751 Secondary polycythemia: Secondary | ICD-10-CM | POA: Diagnosis not present

## 2016-04-15 DIAGNOSIS — Z85820 Personal history of malignant melanoma of skin: Secondary | ICD-10-CM | POA: Diagnosis not present

## 2016-04-15 DIAGNOSIS — J449 Chronic obstructive pulmonary disease, unspecified: Secondary | ICD-10-CM | POA: Diagnosis not present

## 2016-04-15 DIAGNOSIS — Z87891 Personal history of nicotine dependence: Secondary | ICD-10-CM

## 2016-04-15 DIAGNOSIS — Z79899 Other long term (current) drug therapy: Secondary | ICD-10-CM

## 2016-04-15 NOTE — Progress Notes (Signed)
No changes since last week.  SOB on exertion and at rest at times. Reports having Emphysema.  Wears O2 at home at times not always 2-3L via [place]

## 2016-04-15 NOTE — Progress Notes (Signed)
[facility] day:  04/15/2016  Chief Complaint: Sandra Daniel is a 65 y.o. female with polycythemia who is seen for review of work-up and discussion regarding direction of therapy.    HPI:  The patient was last seen in the hematology clinic on 04/08/2016.  At that time, she was seen for initial consultation.  She was felt to have probable compensatory erythrocytosis due to hypoxia from COPD and smoking.  She may have some component of sleep apnea.  Symptomatically, she noted fatigue, visual changes, paresthesias, and thinking issues which may be due to a high viscosity associated with her erythrocytosis.   She underwent an evaluation.  CBC revealed a hematocrit of 46.4, hemoglobin 15.6, platelets 245,000, WBC 10,500.  B12 was 309.  Folate was 8.9.  Ferritin was 278 with 28% iron saturation and a TIBC of 319 (normal).  Retic was 2%.  TSH was 3.136.  LFTs were normal.  Carbon monoxide level was 2.4%.  Epo level was 14.5 (normal).  JAK2 was negative for V617F and exon 12.  Urinalysis reveled no hematuria.  Abdominal ultrasound on 04/14/2016 revealed increased hepatic echotexture likely reflects fatty infiltrative change. There was no definite cirrhotic changes. There was limited visualization of the pancreatic head and tail.  Spleen was normal.  There was no acute abnormality of the kidneys or abdominal aorta.  She has been told that she has been B12 deficient in the past.    Symptomatically, she notes that she can't move around much.  She notes shortness of breath with exertion.  She uses oxygen at home.  She is weak.  She is trying to cut back on her smoking.  She still notes some tingling at night.  She has an appointment with the ophthalmologist on 04/20/2016.   Past Medical History  Diagnosis Date  . Melanoma in situ of back ([place])   . COPD (chronic obstructive pulmonary disease) ([place])   . Depression   . Hypertension   . Anxiety   .  Thrombocytopenia ([place])   . Alcohol abuse   . Peripheral arterial occlusive disease ([place])   . Hyperlipidemia   . Vascular disorder of lower extremity     right big toe-peeling and discoloration  . Polycythemia rubra vera ([place])   . Idiopathic insomnia   . Chronic respiratory failure with hypoxia ([place])   . Influenza due to influenza A virus 01/17/2016    Past Surgical History  Procedure Laterality Date  . Abdominal hysterectomy    . Tonsillectomy    . Lumbar back surgery      Family History  Problem Relation Age of Onset  . CAD Father     age 41  . Heart attack Father   . Stroke Mother   . Stroke Paternal Aunt   . Stroke Paternal Uncle   . Stroke Paternal Grandmother   . Vascular Disease Paternal Grandfather     Social History:  reports that she quit smoking about 9 months ago. She does not have any smokeless tobacco history on file. She reports that she drinks alcohol. She reports that she does not use illicit drugs.  She smoked 1 - 1 1/2 packs a day from age 56 to 26 (40-50 pack year).  She quit smoking for 7 years then started smoking again.  She stopped smoking on 07/09/2015, but between 06/2015 and 09/2015, she smoked 5 packs of cigarettes. Her husband died 3 years ago (2013-05-15).  She lives in [place], [place].  The patient is alone today.  Allergies:  Allergies  Allergen Reactions  . Atorvastatin     myalgia  . Lorazepam Anxiety    Current Medications: Current Outpatient Prescriptions  Medication Sig Dispense Refill  . albuterol (PROAIR HFA) 108 (90 Base) MCG/ACT inhaler Inhale 2 puffs into the lungs 4 (four) times daily as needed. 18 g 5  . ALPRAZolam (XANAX) 0.5 MG tablet TAKE 1 TABLET BY MOUTH 2 TIMES DAILY 60 tablet 2  . amLODipine-benazepril (LOTREL) 5-10 MG capsule Take 1 capsule by mouth daily. 30 capsule 5  . metoprolol succinate (TOPROL-XL) 50 MG 24 hr tablet Take 1 tablet (50 mg total) by mouth daily. Take with or immediately following a meal. 30 tablet 5  .  mirtazapine (REMERON) 15 MG tablet TAKE 1 TABLET BY MOUTH AT BEDTIME 30 tablet 5  . PARoxetine (PAXIL) 30 MG tablet Take 1 tablet (30 mg total) by mouth daily. 30 tablet 5  . sodium chloride (OCEAN) 0.65 % nasal spray Place into the nose.    . budesonide-formoterol (SYMBICORT) 160-4.5 MCG/ACT inhaler Inhale 2 puffs into the lungs 2 (two) times daily. (Patient not taking: Reported on 04/15/2016) 1 Inhaler 5   No current facility-administered medications for this visit.    Review of Systems:  GENERAL:  Fatigue.  Minimally active.  No fevers or sweats.  Weight down 2 pounds since last visit. PERFORMANCE STATUS (ECOG):  2 HEENT:  Visual changes (appointment with ophthalmology).  No runny nose, sore throat, mouth sores or tenderness. Lungs:  Chronic shortness of breath with exertion and sometimes at rest.  No hemoptysis. Cardiac:  No chest pain, palpitations, orthopnea, or PND. GI:  No nausea, vomiting, diarrhea, constipation, melena or hematochezia. GU:  No urgency, frequency, dysuria, or hematuria. Musculoskeletal:  No back pain.  No joint pain.  Muscle ache when walking. Extremities:  No pain or swelling. Skin:  Left great toe peeling. Prescribed antibiotics by podiatrist, but stopped secondary to diarrhea.  Neuro:  Paresthesias in toes > fingers.  Feet numb.  Thoughts not as sharp.  No headache or weakness.  Balance issues. Endocrine:  No diabetes, thyroid issues, hot flashes or night sweats. Psych:  No mood changes, depression or anxiety. Pain:  No focal pain. Review of systems:  All other systems reviewed and found to be negative.  Physical Exam: Blood pressure 123/72, pulse 80, temperature 97.9 F (36.6 C), temperature source Tympanic, resp. rate 18, height 5\' 5"  (1.651 m), weight 156 lb 10.2 oz (71.05 kg), SpO2 90 %. GENERAL:  Well developed, well nourished, sitting comfortably in the exam room in no acute distress. MENTAL STATUS:  Alert and oriented to person, place and time. HEAD:   Long brown hair with blonde highlights.  Normocephalic, atraumatic, face full and symmetric, no Cushingoid features. EYES:  Blue eyes.  Eyes injected.  No scleral icterus. NEUROLOGICAL: Unremarkable. PSYCH:  Appropriate.  No visits with results within 3 Day(s) from this visit. Latest known visit with results is:  Appointment on 04/08/2016  Component Date Value Ref Range Status  . Carbon Monoxide, Blood 04/08/2016 2.4  0.0 - 3.6 % Final   Comment: (NOTE)                            Environmental Exposure:                             Nonsmokers           <  3.7                             Smokers              <9.9                            Occupational Exposure:                             BEI                   3.5                                Detection Limit =  0.2 Performed At: [facility] [place], [place] [id number] Lindon Romp MD [id number]     Assessment:  Sandra Daniel is a 64 y.o. female with probable compensatory erythrocytosis due to hypoxia from COPD and smoking.  She stopped smoking in 10/2015.  She may have some component of sleep apnea.  She is on oxygen 2 -2 1/2 liters via [place] a day, but uses it sparingly.  Review of available labs notes erythrocytosis since at least 08/05/2013.  At that time, hematocrit was 55, hemoglobin 18.8 and MCV 98.  Hematocrit has ranged between 50.9 - 52.3 with a hemoglobin 16.2 - 17.5.  MCV has ranged between 99 - 107.  Platelet count was 102,000 - 134,000 in 11/2013 and 12/2015.  Work-up on 04/08/2016 revealed a hematocrit of 46.4, hemoglobin 15.6, platelets 245,000, WBC 10,500.  Normal studies included B12 (309), folate, ferritin, iron studies, retic (2%), TSH, LFTs, carbon monoxide level (2.4%), epo level (14.5), JAK2 V617F and exon 12.  Urinalysis reveled no hematuria.  Abdominal ultrasound on 04/14/2016 revealed increased hepatic echotexture likely reflects fatty infiltrative change.  She has been told that she  has been B12 deficient in the past.    Her diet is poor.  She drinks 12 cans of beer a day.  She has never had a colonoscopy (refuses).  She has a mammogram every 3 years.  Symptomatically, she notes fatigue.  Exam is stable.  Plan: 1.  Discuss work-up.  Hematocrit improved over prior baseline labs.  No evidence of a myeloproliferative disorder.  Erythrocytosis is likely secondary to COPD and smoking.   She may have some component of sleep apnea.  No need for phlebotomy today. 2.  Discuss borderline low B12.  Discuss plan to check MMA. 3.  Labs today:  MMA. 4.  Follow-up with ophthalmology as scheduled. 5.  Encourage aspirin 81 mg a day. 6.  Consider overnight oximetry. 7.  RTC in 4 weeks for MD assess, labs (CBC with diff), and +/- B12 and +/- phlebotomy.   Lequita Asal, MD  04/15/2016, 2:38 PM

## 2016-04-19 LAB — METHYLMALONIC ACID, SERUM: Methylmalonic Acid, Quantitative: 261 nmol/L (ref 0–378)

## 2016-04-20 LAB — JAK2  V617F QUAL. WITH REFLEX TO EXON 12

## 2016-04-20 LAB — JAK2 EXON 12 MUTATION ANALYSIS

## 2016-04-20 LAB — ERYTHROPOIETIN: Erythropoietin: 14.5 m[IU]/mL (ref 2.6–18.5)

## 2016-04-30 ENCOUNTER — Telehealth: Payer: Self-pay

## 2016-04-30 NOTE — Telephone Encounter (Signed)
Called pt.  Pt would like to know if if she needs to start PO b-12 or injections?  I informed PT that I had not heard anything back from the doctor at this time and I would send MD a message to see if pt needs either.  No other concerns noted.  Advised pt to call with any other concerns.

## 2016-05-01 ENCOUNTER — Telehealth: Payer: Self-pay

## 2016-05-01 NOTE — Telephone Encounter (Signed)
-----   Message from Lequita Asal, MD sent at 04/30/2016  7:20 PM EDT ----- Regarding: RE: PATIENT REQUESTING RESULTS Contact: [phone]  B12 level is ok right now.  Would do oral B12 not B12 injections.  M  ----- Message -----    From: Franchot Heidelberg, RN    Sent: 04/30/2016   5:53 PM      To: Lequita Asal, MD Subject: FW: PATIENT REQUESTING RESULTS                 Called pt.  Pt would like to know if if she needs to start PO b-12 or injections?  I informed PT that I had not heard anything back from you yet.  Please advise.  Thank you.   Aadhav Uhlig    ----- Message -----    From: Shawnee Knapp, RN    Sent: 04/29/2016   3:54 PM      To: Lequita Asal, MD, Franchot Heidelberg, RN Subject: PATIENT REQUESTING RESULTS                     Patient states she was expecting call regarding B12 level and plan of care? Please return call

## 2016-05-01 NOTE — Telephone Encounter (Signed)
Called pt and spoke with her that she could do oral B-12 per MD.  Pt verbalized an understanding.  No other concerns at this time.

## 2016-05-13 ENCOUNTER — Inpatient Hospital Stay: Payer: Medicare HMO | Attending: Hematology and Oncology

## 2016-05-13 ENCOUNTER — Encounter: Payer: Self-pay | Admitting: Hematology and Oncology

## 2016-05-13 ENCOUNTER — Inpatient Hospital Stay (HOSPITAL_BASED_OUTPATIENT_CLINIC_OR_DEPARTMENT_OTHER): Payer: Medicare HMO | Admitting: Hematology and Oncology

## 2016-05-13 ENCOUNTER — Inpatient Hospital Stay: Payer: Medicare HMO

## 2016-05-13 VITALS — BP 113/66 | HR 73 | Temp 98.4°F | Resp 22 | Wt 154.7 lb

## 2016-05-13 DIAGNOSIS — Z79899 Other long term (current) drug therapy: Secondary | ICD-10-CM | POA: Insufficient documentation

## 2016-05-13 DIAGNOSIS — F419 Anxiety disorder, unspecified: Secondary | ICD-10-CM | POA: Diagnosis not present

## 2016-05-13 DIAGNOSIS — I739 Peripheral vascular disease, unspecified: Secondary | ICD-10-CM | POA: Insufficient documentation

## 2016-05-13 DIAGNOSIS — D751 Secondary polycythemia: Secondary | ICD-10-CM | POA: Diagnosis not present

## 2016-05-13 DIAGNOSIS — J449 Chronic obstructive pulmonary disease, unspecified: Secondary | ICD-10-CM | POA: Diagnosis not present

## 2016-05-13 DIAGNOSIS — E785 Hyperlipidemia, unspecified: Secondary | ICD-10-CM | POA: Insufficient documentation

## 2016-05-13 DIAGNOSIS — I10 Essential (primary) hypertension: Secondary | ICD-10-CM | POA: Insufficient documentation

## 2016-05-13 DIAGNOSIS — Z87891 Personal history of nicotine dependence: Secondary | ICD-10-CM | POA: Diagnosis not present

## 2016-05-13 DIAGNOSIS — F329 Major depressive disorder, single episode, unspecified: Secondary | ICD-10-CM | POA: Diagnosis not present

## 2016-05-13 DIAGNOSIS — R0902 Hypoxemia: Secondary | ICD-10-CM

## 2016-05-13 DIAGNOSIS — E538 Deficiency of other specified B group vitamins: Secondary | ICD-10-CM

## 2016-05-13 LAB — CBC WITH DIFFERENTIAL/PLATELET
Basophils Absolute: 0.1 10*3/uL (ref 0–0.1)
Basophils Relative: 1 %
Eosinophils Absolute: 0.3 10*3/uL (ref 0–0.7)
Eosinophils Relative: 3 %
HCT: 47.9 % — ABNORMAL HIGH (ref 35.0–47.0)
Hemoglobin: 16.3 g/dL — ABNORMAL HIGH (ref 12.0–16.0)
Lymphocytes Relative: 23 %
Lymphs Abs: 2.5 10*3/uL (ref 1.0–3.6)
MCH: 34.5 pg — ABNORMAL HIGH (ref 26.0–34.0)
MCHC: 34.1 g/dL (ref 32.0–36.0)
MCV: 101.1 fL — ABNORMAL HIGH (ref 80.0–100.0)
Monocytes Absolute: 1 10*3/uL — ABNORMAL HIGH (ref 0.2–0.9)
Monocytes Relative: 9 %
Neutro Abs: 7.2 10*3/uL — ABNORMAL HIGH (ref 1.4–6.5)
Neutrophils Relative %: 64 %
Platelets: 230 10*3/uL (ref 150–440)
RBC: 4.74 MIL/uL (ref 3.80–5.20)
RDW: 12.7 % (ref 11.5–14.5)
WBC: 11 10*3/uL (ref 3.6–11.0)

## 2016-05-13 NOTE — Progress Notes (Signed)
[facility] day:  05/13/2016  Chief Complaint: Sandra Daniel is a 65 y.o. female with polycythemia who is seen for 1 month assessment.  HPI:  The patient was last seen in the hematology clinic on 04/15/2016.  At that time, work-up was reviewed.  She appeared to have secondary erythrocytosis secondary to smoking and possible sleep apnea.  B12 level was low normal.  MMA was normal, ruling out B12 deficiency.  Hematocrit was 46.4 with a hemoglobin of 15.6.  She did not require a phlebotomy.  During the interim, she notes that the ophthalmologist told her that she has blood behind her retina.  She sees Dr. Jeneen Rinks at [facility] in [place], [place].  She has not been smoking since 09/2015.  She has not had overnight oximetry to r/o sleep apnea.   Past Medical History  Diagnosis Date  . Melanoma in situ of back ([place])   . COPD (chronic obstructive pulmonary disease) ([place])   . Depression   . Hypertension   . Anxiety   . Thrombocytopenia ([place])   . Alcohol abuse   . Peripheral arterial occlusive disease ([place])   . Hyperlipidemia   . Vascular disorder of lower extremity     right big toe-peeling and discoloration  . Polycythemia rubra vera ([place])   . Idiopathic insomnia   . Chronic respiratory failure with hypoxia ([place])   . Influenza due to influenza A virus 01/17/2016    Past Surgical History  Procedure Laterality Date  . Abdominal hysterectomy    . Tonsillectomy    . Lumbar back surgery      Family History  Problem Relation Age of Onset  . CAD Father     age 57  . Heart attack Father   . Stroke Mother   . Stroke Paternal Aunt   . Stroke Paternal Uncle   . Stroke Paternal Grandmother   . Vascular Disease Paternal Grandfather     Social History:  reports that she quit smoking about 10 months ago. She does not have any smokeless tobacco history on file. She reports that she drinks alcohol. She reports that she does not use illicit drugs.   She smoked 1 - 1 1/2 packs a day from age 21 to 62 (40-50 pack year).  She quit smoking for 7 years then started smoking again.  She stopped smoking on 07/09/2015, but between 06/2015 and 09/2015, she smoked 5 packs of cigarettes. Her husband died 3 years ago (May 10, 2013).  She lives in [place], [place].  The patient is accompanied by her daughter,  Lelan Pons, today.  Allergies:  Allergies  Allergen Reactions  . Atorvastatin     myalgia  . Lorazepam Anxiety    Current Medications: Current Outpatient Prescriptions  Medication Sig Dispense Refill  . albuterol (PROAIR HFA) 108 (90 Base) MCG/ACT inhaler Inhale 2 puffs into the lungs 4 (four) times daily as needed. 18 g 5  . ALPRAZolam (XANAX) 0.5 MG tablet TAKE 1 TABLET BY MOUTH 2 TIMES DAILY 60 tablet 2  . amLODipine-benazepril (LOTREL) 5-10 MG capsule Take 1 capsule by mouth daily. 30 capsule 5  . metoprolol succinate (TOPROL-XL) 50 MG 24 hr tablet Take 1 tablet (50 mg total) by mouth daily. Take with or immediately following a meal. 30 tablet 5  . mirtazapine (REMERON) 15 MG tablet TAKE 1 TABLET BY MOUTH AT BEDTIME 30 tablet 5  . PARoxetine (PAXIL) 30 MG tablet Take 1 tablet (30 mg total) by  mouth daily. 30 tablet 5  . sodium chloride (OCEAN) 0.65 % nasal spray Place into the nose.    . budesonide-formoterol (SYMBICORT) 160-4.5 MCG/ACT inhaler Inhale 2 puffs into the lungs 2 (two) times daily. (Patient not taking: Reported on 04/15/2016) 1 Inhaler 5   No current facility-administered medications for this visit.    Review of Systems:  GENERAL:  Fatigue.  Minimally active.  No fevers or sweats.  Weight down 2 pounds since last visit. PERFORMANCE STATUS (ECOG):  2 HEENT:  Visual changes with "blood behind retina".  No runny nose, sore throat, mouth sores or tenderness. Lungs:  Chronic shortness of breath with exertion and sometimes at rest.  No hemoptysis. Cardiac:  No chest pain, palpitations, orthopnea, or PND. GI:  No nausea, vomiting, diarrhea,  constipation, melena or hematochezia. GU:  No urgency, frequency, dysuria, or hematuria. Musculoskeletal:  No back pain.  No joint pain.  Muscle ache when walking. Extremities:  No pain or swelling. Skin:  No rashes. Neuro:  Paresthesias in toes > fingers.  Feet numb.  No headache or weakness.  Balance issues. Endocrine:  No diabetes, thyroid issues, hot flashes or night sweats. Psych:  No mood changes, depression or anxiety. Pain:  No focal pain. Review of systems:  All other systems reviewed and found to be negative.  Physical Exam: Blood pressure 113/66, pulse 73, temperature 98.4 F (36.9 C), temperature source Tympanic, resp. rate 22, weight 154 lb 10.4 oz (70.15 kg), SpO2 92 %. GENERAL:  Well developed, well nourished, sitting comfortably in the infusion center in no acute distress. MENTAL STATUS:  Alert and oriented to person, place and time. HEAD:  Long brown hair with blonde highlights.  Normocephalic, atraumatic, face full and symmetric, no Cushingoid features. EYES:  Blue eyes.  No conjunctivitis or  scleral icterus. CHEST:  Barrel shaped chest. RESPIRATORY:   Soft expiratory wheeze on deep breathing.  No rales, wheezes or rhonchi. CARDIOVASCULAR:  Regular rate and rhythm without murmur, rub or gallop. NEUROLOGICAL: Unremarkable. PSYCH:  Appropriate.   Appointment on 05/13/2016  Component Date Value Ref Range Status  . WBC 05/13/2016 11.0  3.6 - 11.0 K/uL Final  . RBC 05/13/2016 4.74  3.80 - 5.20 MIL/uL Final  . Hemoglobin 05/13/2016 16.3* 12.0 - 16.0 g/dL Final  . HCT 05/13/2016 47.9* 35.0 - 47.0 % Final  . MCV 05/13/2016 101.1* 80.0 - 100.0 fL Final  . MCH 05/13/2016 34.5* 26.0 - 34.0 pg Final  . MCHC 05/13/2016 34.1  32.0 - 36.0 g/dL Final  . RDW 05/13/2016 12.7  11.5 - 14.5 % Final  . Platelets 05/13/2016 230  150 - 440 K/uL Final  . Neutrophils Relative % 05/13/2016 64   Final  . Neutro Abs 05/13/2016 7.2* 1.4 - 6.5 K/uL Final  . Lymphocytes Relative 05/13/2016  23   Final  . Lymphs Abs 05/13/2016 2.5  1.0 - 3.6 K/uL Final  . Monocytes Relative 05/13/2016 9   Final  . Monocytes Absolute 05/13/2016 1.0* 0.2 - 0.9 K/uL Final  . Eosinophils Relative 05/13/2016 3   Final  . Eosinophils Absolute 05/13/2016 0.3  0 - 0.7 K/uL Final  . Basophils Relative 05/13/2016 1   Final  . Basophils Absolute 05/13/2016 0.1  0 - 0.1 K/uL Final    Assessment:  ARETI GUADAGNOLI is a 65 y.o. female with probable compensatory erythrocytosis due to hypoxia from COPD and smoking.  She stopped smoking in 10/2015.  She may have some component o++f sleep apnea.  She  is on oxygen 2 -2 1/2 liters via  a day, but uses it sparingly.  Review of available labs notes erythrocytosis since at least 08/05/2013.  At that time, hematocrit was 55, hemoglobin 18.8 and MCV 98.  Hematocrit has ranged between 50.9 - 52.3 with a hemoglobin 16.2 - 17.5.  MCV has ranged between 99 - 107.  Platelet count was 102,000 - 134,000 in 11/2013 and 12/2015.  Work-up on 04/08/2016 revealed a hematocrit of 46.4, hemoglobin 15.6, platelets 245,000, WBC 10,500.  Normal studies included B12 (309), folate, ferritin, iron studies, retic (2%), TSH, LFTs, carbon monoxide level (2.4%), epo level (14.5), JAK2 V617F and exon 12.  Urinalysis reveled no hematuria.  Abdominal ultrasound on 04/14/2016 revealed increased hepatic echotexture likely reflects fatty infiltrative change.  She has been told that she has been B12 deficient in the past.  B12 was 309 (low normal) on 04/08/2016.  MMA was normal on 04/15/2016 ruling out B12 deficiency.  Her diet is poor.  She drinks 12 cans of beer a day.  She has never had a colonoscopy (refuses).  She has a mammogram every 3 years.  Symptomatically, she notes chronic fatigue.  She is extremely limited secondary to her pulmonary issues.  She has blood behind her retina per ophthalmology.  Exam is stable.  Plan: 1.  Discuss work-up.  Hematocrit improved over prior baseline labs.  No  evidence of a myeloproliferative disorder.  No indication for phlebotomy unless patient symptomatic.  Erythrocytosis is secondary to COPD and prior history of smoking.   She may have some component of sleep apnea.  Discuss need for formal testing. 2.  Discuss no current evidence of B12 deficiency.  Consider rechecking in the future. 3.  Labs today:  CBC with diff. 4.  Encourage aspirin 81 mg a day. 5.  Encourage overnight oximetry. 6.  Phone follow-up with Dr Jeneen Rinks at [facility] in [place], [place]. 7.  RTC prn.   Lequita Asal, MD  05/13/2016, 2:55 PM

## 2016-05-13 NOTE — Progress Notes (Signed)
Patient here today for a B12 shot and possible phlebotomy. Reports low appetite eats 1 meal a day but states she drinks 12 beers per day and thinks that fills her up so that she does not get hungry. Stools tend to be on the loose side. Takes xanax to help her sleep at times. Patient stays very fatigued.Dyspnea with mild exertion. O2 saturation was 92 % room air today.

## 2016-06-06 ENCOUNTER — Other Ambulatory Visit: Payer: Self-pay | Admitting: Internal Medicine

## 2016-06-17 ENCOUNTER — Encounter: Payer: Medicare Other | Admitting: Internal Medicine

## 2016-07-13 ENCOUNTER — Encounter: Payer: Medicare Other | Admitting: Internal Medicine

## 2016-07-14 ENCOUNTER — Telehealth: Payer: Self-pay

## 2016-07-14 ENCOUNTER — Other Ambulatory Visit: Payer: Self-pay | Admitting: Internal Medicine

## 2016-07-14 MED ORDER — ALPRAZOLAM 0.5 MG PO TABS: 0.5000 mg | ORAL_TABLET | Freq: Two times a day (BID) | ORAL | 0 refills | Status: DC

## 2016-07-14 NOTE — Progress Notes (Signed)
This encounter was created in error - please disregard.

## 2016-07-14 NOTE — Telephone Encounter (Signed)
Needs Xanax refill URGENT

## 2016-07-21 ENCOUNTER — Ambulatory Visit: Payer: Medicare Other | Admitting: Internal Medicine

## 2016-07-28 ENCOUNTER — Encounter: Payer: Medicare Other | Admitting: Internal Medicine

## 2016-08-26 ENCOUNTER — Ambulatory Visit: Payer: Medicare Other | Admitting: Internal Medicine

## 2016-08-26 ENCOUNTER — Ambulatory Visit (INDEPENDENT_AMBULATORY_CARE_PROVIDER_SITE_OTHER): Payer: Medicare HMO | Admitting: Internal Medicine

## 2016-08-26 ENCOUNTER — Encounter: Payer: Self-pay | Admitting: Internal Medicine

## 2016-08-26 VITALS — BP 143/80 | HR 78 | Resp 16 | Ht 65.0 in | Wt 153.4 lb

## 2016-08-26 DIAGNOSIS — J439 Emphysema, unspecified: Secondary | ICD-10-CM

## 2016-08-26 DIAGNOSIS — Z23 Encounter for immunization: Secondary | ICD-10-CM | POA: Diagnosis not present

## 2016-08-26 DIAGNOSIS — F411 Generalized anxiety disorder: Secondary | ICD-10-CM | POA: Diagnosis not present

## 2016-08-26 DIAGNOSIS — I10 Essential (primary) hypertension: Secondary | ICD-10-CM

## 2016-08-26 MED ORDER — ALPRAZOLAM 0.5 MG PO TABS: 0.5000 mg | ORAL_TABLET | Freq: Two times a day (BID) | ORAL | 5 refills | Status: DC

## 2016-08-26 MED ORDER — DESVENLAFAXINE SUCCINATE ER 50 MG PO TB24: 50.0000 mg | ORAL_TABLET | Freq: Every day | ORAL | 3 refills | Status: DC

## 2016-08-26 NOTE — Progress Notes (Signed)
Date:  08/26/2016   Name:  Sandra Daniel   DOB:  05-10-1951   MRN:  [id number]   Chief Complaint: No chief complaint on file. Hypertension  This is a chronic problem. The problem is unchanged. The problem is controlled. Associated symptoms include shortness of breath. Pertinent negatives include no chest pain or palpitations.   COPD - has not used Oxygen in months.  No new SOB or URI.  She only has albuterol - symbicort too expensive.  She wants to turn in the O2.   Anxiety - chronic symptoms despite Paxil and Remeron.  No motivation to do anything.  She does not clean her house.  Has not opened her mail in several months.  She continues to drink and is only eating one meal per day.  Review of Systems  Constitutional: Negative for chills, fatigue and fever.  Respiratory: Positive for chest tightness and shortness of breath. Negative for cough and choking.   Cardiovascular: Negative for chest pain, palpitations and leg swelling.  Gastrointestinal: Negative for abdominal pain and blood in stool.  Neurological: Negative for dizziness.  Hematological: Negative for adenopathy. Does not bruise/bleed easily.  Psychiatric/Behavioral: Positive for decreased concentration and dysphoric mood. Negative for sleep disturbance and suicidal ideas. The patient is nervous/anxious.     Patient Active Problem List   Diagnosis Date Noted  . Macrocytosis without anemia 04/08/2016  . Erythrocytosis 04/08/2016  . Pre-ulcerative corn or callous 01/30/2016  . Peripheral arterial occlusive disease ([place]) 08/07/2015  . H/O amaurosis fugax 08/07/2015  . Below normal amount of sodium in the blood 07/10/2015  . Chronic respiratory failure with hypoxia ([place]) 07/10/2015  . AA (alcohol abuse) 07/08/2015  . Anxiety disorder 07/08/2015  . Essential (primary) hypertension 07/08/2015  . Blood glucose elevated 07/08/2015  . HLD (hyperlipidemia) 07/08/2015  . Depression, major, single episode, in partial remission  ([place]) 07/08/2015  . H/O Malignant melanoma 07/08/2015  . Idiopathic insomnia 07/08/2015  . Chronic obstructive pulmonary emphysema ([place]) 07/08/2015  . Avitaminosis D 07/08/2015  . Thrombocytopenia ([place]) 12/04/2013  . Tobacco use 12/04/2013    Prior to Admission medications   Medication Sig Start Date End Date Taking? Authorizing Provider  albuterol (PROAIR HFA) 108 (90 Base) MCG/ACT inhaler Inhale 2 puffs into the lungs 4 (four) times daily as needed. 01/30/16  Yes Glean Hess, MD  ALPRAZolam Duanne Moron) 0.5 MG tablet Take 1 tablet (0.5 mg total) by mouth 2 (two) times daily. 07/14/16  Yes Glean Hess, MD  amLODipine-benazepril (LOTREL) 5-10 MG capsule Take 1 capsule by mouth daily. 01/30/16  Yes Glean Hess, MD  budesonide-formoterol [facility]) 160-4.5 MCG/ACT inhaler Inhale 2 puffs into the lungs 2 (two) times daily. 01/30/16 01/29/17 Yes Glean Hess, MD  metoprolol succinate (TOPROL-XL) 50 MG 24 hr tablet Take 1 tablet (50 mg total) by mouth daily. Take with or immediately following a meal. 01/30/16  Yes Glean Hess, MD  mirtazapine (REMERON) 15 MG tablet TAKE 1 TABLET AT BEDTIME 06/07/16  Yes Glean Hess, MD  PARoxetine (PAXIL) 30 MG tablet Take 1 tablet (30 mg total) by mouth daily. 01/30/16  Yes Glean Hess, MD  sodium chloride (OCEAN) 0.65 % nasal spray Place into the nose. 01/24/16  Yes Historical Provider, MD    Allergies  Allergen Reactions  . Atorvastatin     myalgia  . Lorazepam Anxiety    Past Surgical History:  Procedure Laterality Date  . ABDOMINAL HYSTERECTOMY    . Lumbar back  surgery    . TONSILLECTOMY      Social History  Substance Use Topics  . Smoking status: Former Smoker    Quit date: 07/08/2015  . Smokeless tobacco: Never Used  . Alcohol use 0.0 oz/week     Comment: 12 beers per day     Medication list has been reviewed and updated.   Physical Exam  Constitutional: She is oriented to person, place, and time. She appears  well-developed. No distress.  HENT:  Head: Normocephalic and atraumatic.  Cardiovascular: Normal rate, regular rhythm and normal heart sounds.   Pulmonary/Chest: Effort normal. No respiratory distress. She has no wheezes. She has no rales.  Musculoskeletal: Normal range of motion.  Neurological: She is alert and oriented to person, place, and time.  Skin: Skin is warm and dry. No rash noted.  Psychiatric: Her speech is normal and behavior is normal. Thought content normal. Her mood appears anxious. She exhibits a depressed mood.  Nursing note and vitals reviewed.   BP (!) 143/80   Pulse 78   Resp 16   Ht 5\' 5"  (1.651 m)   Wt 153 lb 6.4 oz (69.6 kg)   SpO2 90%   BMI 25.53 kg/m   Assessment and Plan: 1. Need for influenza vaccination - Flu Vaccine QUAD 36+ mos IM  2. Essential (primary) hypertension controlled  3. Pulmonary emphysema, unspecified emphysema type ([place]) Continue albuterol PRN Samples of symbicort given Keep O2 for now  4. Generalized anxiety disorder Need to change therapy due to apathy Stop Paxil and begin Pristiq - ALPRAZolam (XANAX) 0.5 MG tablet; Take 1 tablet (0.5 mg total) by mouth 2 (two) times daily.  Dispense: 60 tablet; Refill: 5 - desvenlafaxine (PRISTIQ) 50 MG 24 hr tablet; Take 1 tablet (50 mg total) by mouth daily.  Dispense: 30 tablet; Refill: [place], MD [facility]  08/26/2016

## 2016-10-01 ENCOUNTER — Telehealth: Payer: Self-pay

## 2016-10-01 NOTE — Telephone Encounter (Signed)
May continue on current Paxil dose.  Wait for symptoms to stabilize then follow up if you want to discuss other medication or change in dose.

## 2016-10-01 NOTE — Telephone Encounter (Signed)
Having side effects from new Depression RX Pristiq. Has tingling and numbness in hands. Has many crying spells. Went off meds for 2 days and then yesterday went on the old Rx Paxil. Can she please go back on Paxil and higher dose?

## 2016-10-02 NOTE — Telephone Encounter (Signed)
Advised 

## 2016-10-06 ENCOUNTER — Encounter: Payer: Self-pay | Admitting: Internal Medicine

## 2016-10-06 ENCOUNTER — Ambulatory Visit (INDEPENDENT_AMBULATORY_CARE_PROVIDER_SITE_OTHER): Payer: Medicare HMO | Admitting: Internal Medicine

## 2016-10-06 VITALS — BP 120/76 | HR 68 | Resp 16 | Ht 65.0 in | Wt 157.0 lb

## 2016-10-06 DIAGNOSIS — Z0000 Encounter for general adult medical examination without abnormal findings: Secondary | ICD-10-CM

## 2016-10-06 DIAGNOSIS — I10 Essential (primary) hypertension: Secondary | ICD-10-CM | POA: Diagnosis not present

## 2016-10-06 DIAGNOSIS — F324 Major depressive disorder, single episode, in partial remission: Secondary | ICD-10-CM | POA: Diagnosis not present

## 2016-10-06 DIAGNOSIS — J9611 Chronic respiratory failure with hypoxia: Secondary | ICD-10-CM

## 2016-10-06 DIAGNOSIS — I779 Disorder of arteries and arterioles, unspecified: Secondary | ICD-10-CM | POA: Diagnosis not present

## 2016-10-06 DIAGNOSIS — D696 Thrombocytopenia, unspecified: Secondary | ICD-10-CM

## 2016-10-06 DIAGNOSIS — Z0001 Encounter for general adult medical examination with abnormal findings: Secondary | ICD-10-CM | POA: Diagnosis not present

## 2016-10-06 LAB — POCT URINALYSIS DIPSTICK
Bilirubin, UA: NEGATIVE
Blood, UA: NEGATIVE
GLUCOSE UA: NEGATIVE
Ketones, UA: NEGATIVE
Leukocytes, UA: NEGATIVE
NITRITE UA: NEGATIVE
Protein, UA: NEGATIVE
Spec Grav, UA: <=1.005
UROBILINOGEN UA: 0.2
pH, UA: 5

## 2016-10-06 MED ORDER — PAROXETINE HCL 30 MG PO TABS: 30.0000 mg | ORAL_TABLET | Freq: Every day | ORAL | 5 refills | Status: DC

## 2016-10-06 MED ORDER — AMLODIPINE BESY-BENAZEPRIL HCL 5-10 MG PO CAPS: 1.0000 | ORAL_CAPSULE | Freq: Every day | ORAL | 5 refills | Status: DC

## 2016-10-06 MED ORDER — METOPROLOL SUCCINATE ER 50 MG PO TB24: 50.0000 mg | ORAL_TABLET | Freq: Every day | ORAL | 5 refills | Status: DC

## 2016-10-06 NOTE — Patient Instructions (Signed)
Health Maintenance  Topic Date Due  . Hepatitis C Screening  10-Jan-1951  . HIV Screening  01/17/1966  . TETANUS/TDAP  01/17/1970  . MAMMOGRAM  01/17/2001  . COLONOSCOPY  01/17/2001  . ZOSTAVAX  01/18/2011  . DEXA SCAN  01/18/2016  . PNA vac Low Risk Adult (1 of 2 - PCV13) 01/18/2016  . INFLUENZA VACCINE  Completed  . PAP SMEAR  Excluded     Breast Self-Awareness Introduction Breast self-awareness means being familiar with how your breasts look and feel. It involves checking your breasts regularly and reporting any changes to your health care provider. Practicing breast self-awareness is important. A change in your breasts can be a sign of a serious medical problem. Being familiar with how your breasts look and feel allows you to find any problems early, when treatment is more likely to be successful. All women should practice breast self-awareness, including women who have had breast implants. How to do a breast self-exam One way to learn what is normal for your breasts and whether your breasts are changing is to do a breast self-exam. To do a breast self-exam: Look for Changes  1. Remove all the clothing above your waist. 2. Stand in front of a mirror in a room with good lighting. 3. Put your hands on your hips. 4. Push your hands firmly downward. 5. Compare your breasts in the mirror. Look for differences between them (asymmetry), such as:  Differences in shape.  Differences in size.  Puckers, dips, and bumps in one breast and not the other. 6. Look at each breast for changes in your skin, such as:  Redness.  Scaly areas. 7. Look for changes in your nipples, such as:  Discharge.  Bleeding.  Dimpling.  Redness.  A change in position. Feel for Changes  Carefully feel your breasts for lumps and changes. It is best to do this while lying on your back on the floor and again while sitting or standing in the shower or tub with soapy water on your skin. Feel each breast in  the following way:  Place the arm on the side of the breast you are examining above your head.  Feel your breast with the other hand.  Start in the nipple area and make  inch (2 cm) overlapping circles to feel your breast. Use the pads of your three middle fingers to do this. Apply light pressure, then medium pressure, then firm pressure. The light pressure will allow you to feel the tissue closest to the skin. The medium pressure will allow you to feel the tissue that is a little deeper. The firm pressure will allow you to feel the tissue close to the ribs.  Continue the overlapping circles, moving downward over the breast until you feel your ribs below your breast.  Move one finger-width toward the center of the body. Continue to use the  inch (2 cm) overlapping circles to feel your breast as you move slowly up toward your collarbone.  Continue the up and down exam using all three pressures until you reach your armpit. Write Down What You Find  Write down what is normal for each breast and any changes that you find. Keep a written record with breast changes or normal findings for each breast. By writing this information down, you do not need to depend only on memory for size, tenderness, or location. Write down where you are in your menstrual cycle, if you are still menstruating. If you are having trouble noticing differences  in your breasts, do not get discouraged. With time you will become more familiar with the variations in your breasts and more comfortable with the exam. How often should I examine my breasts? Examine your breasts every month. If you are breastfeeding, the best time to examine your breasts is after a feeding or after using a breast pump. If you menstruate, the best time to examine your breasts is 5-7 days after your period is over. During your period, your breasts are lumpier, and it may be more difficult to notice changes. When should I see my health care provider? See  your health care provider if you notice:  A change in shape or size of your breasts or nipples.  A change in the skin of your breast or nipples, such as a reddened or scaly area.  Unusual discharge from your nipples.  A lump or thick area that was not there before.  Pain in your breasts.  Anything that concerns you. This information is not intended to replace advice given to you by your health care provider. Make sure you discuss any questions you have with your health care provider. Document Released: 10/12/2005 Document Revised: 03/19/2016 Document Reviewed: 09/01/2015  2017 [employer]

## 2016-10-06 NOTE — Progress Notes (Signed)
Patient: Sandra Daniel, Female    DOB: 11/18/1950, 65 y.o.   MRN: [id number] Visit Date: 10/06/2016  Today's Provider: Halina Maidens, MD   Chief Complaint  Patient presents with  . Medicare Wellness   Subjective:    Annual wellness visit Sandra Daniel is a 65 y.o. female who presents today for her Subsequent Annual Wellness Visit. She feels fairly well. She reports exercising none. She reports she is sleeping fairly well.   ----------------------------------------------------------- Hypertension  This is a chronic problem. The current episode started more than 1 year ago. The problem is unchanged. The problem is controlled. Pertinent negatives include no chest pain, headaches, palpitations or shortness of breath.   Alcohol intake - still drinking 12 beers per day.  She would like to cut back and plans to start working down after the first of the year.  COPD - doing better since on Symbicort. Using Oxygen as needed.  Still smoking an occasional cigarette.  Anxiety/depression - tried Pristiq but did not tolerated it.  She went back to Paxil and is doing much better.  Foot lesions - has calluses on feet that she peels off and then has sores now - on toes and on heel.  Planning to see Podiatrist.  Review of Systems  Constitutional: Negative for chills, fatigue and fever.  HENT: Negative for congestion, hearing loss, tinnitus, trouble swallowing and voice change.   Eyes: Negative for visual disturbance.  Respiratory: Negative for cough, chest tightness, shortness of breath and wheezing.   Cardiovascular: Negative for chest pain, palpitations and leg swelling.  Gastrointestinal: Negative for abdominal pain, constipation, diarrhea and vomiting.  Endocrine: Negative for polydipsia and polyuria.  Genitourinary: Negative for dysuria, frequency, genital sores, vaginal bleeding and vaginal discharge.  Musculoskeletal: Negative for arthralgias, gait problem and joint swelling.  Skin:  Negative for color change and rash.  Neurological: Negative for dizziness, tremors, light-headedness and headaches.  Hematological: Negative for adenopathy. Does not bruise/bleed easily.  Psychiatric/Behavioral: Negative for dysphoric mood and sleep disturbance. The patient is not nervous/anxious.     Social History   Social History  . Marital status: Married    Spouse name: N/A  . Number of children: N/A  . Years of education: N/A   Occupational History  . Not on file.   Social History Main Topics  . Smoking status: Former Smoker    Quit date: 07/08/2015  . Smokeless tobacco: Never Used  . Alcohol use 0.0 oz/week     Comment: 12 beers per day  . Drug use: No  . Sexual activity: Not on file   Other Topics Concern  . Not on file   Social History Narrative  . No narrative on file    Patient Active Problem List   Diagnosis Date Noted  . Macrocytosis without anemia 04/08/2016  . Erythrocytosis 04/08/2016  . Pre-ulcerative corn or callous 01/30/2016  . Peripheral arterial occlusive disease ([place]) 08/07/2015  . H/O amaurosis fugax 08/07/2015  . Below normal amount of sodium in the blood 07/10/2015  . Chronic respiratory failure with hypoxia ([place]) 07/10/2015  . AA (alcohol abuse) 07/08/2015  . Anxiety disorder 07/08/2015  . Essential (primary) hypertension 07/08/2015  . Blood glucose elevated 07/08/2015  . HLD (hyperlipidemia) 07/08/2015  . Depression, major, single episode, in partial remission ([place]) 07/08/2015  . H/O Malignant melanoma 07/08/2015  . Idiopathic insomnia 07/08/2015  . Chronic obstructive pulmonary emphysema ([place]) 07/08/2015  . Avitaminosis D 07/08/2015  . Thrombocytopenia ([place]) 12/04/2013  . Tobacco use  12/04/2013    Past Surgical History:  Procedure Laterality Date  . ABDOMINAL HYSTERECTOMY    . Lumbar back surgery    . TONSILLECTOMY      Her family history includes CAD in her father; Heart attack in her father; Stroke in her mother, paternal  aunt, paternal grandmother, and paternal uncle; Vascular Disease in her paternal grandfather.     Previous Medications   ALBUTEROL (PROAIR HFA) 108 (90 BASE) MCG/ACT INHALER    Inhale 2 puffs into the lungs 4 (four) times daily as needed.   ALPRAZOLAM (XANAX) 0.5 MG TABLET    Take 1 tablet (0.5 mg total) by mouth 2 (two) times daily.   AMLODIPINE-BENAZEPRIL (LOTREL) 5-10 MG CAPSULE    Take 1 capsule by mouth daily.   BUDESONIDE-FORMOTEROL (SYMBICORT) 160-4.5 MCG/ACT INHALER    Inhale 2 puffs into the lungs 2 (two) times daily.   METOPROLOL SUCCINATE (TOPROL-XL) 50 MG 24 HR TABLET    Take 1 tablet (50 mg total) by mouth daily. Take with or immediately following a meal.   MIRTAZAPINE (REMERON) 15 MG TABLET    TAKE 1 TABLET AT BEDTIME   PAROXETINE (PAXIL) 30 MG TABLET    Take 1 tablet (30 mg total) by mouth daily.    Patient Care Team: Glean Hess, MD as PCP - General (Family Medicine)      Objective:   Vitals: BP 120/76   Pulse 68   Resp 16   Ht 5\' 5"  (1.651 m)   Wt 157 lb (71.2 kg)   SpO2 (!) 87%   BMI 26.13 kg/m   Physical Exam  Constitutional: She is oriented to person, place, and time. She appears well-developed and well-nourished. No distress.  HENT:  Head: Normocephalic and atraumatic.  Right Ear: Tympanic membrane and ear canal normal.  Left Ear: Tympanic membrane and ear canal normal.  Nose: Right sinus exhibits no maxillary sinus tenderness. Left sinus exhibits no maxillary sinus tenderness.  Mouth/Throat: Uvula is midline and oropharynx is clear and moist.  Eyes: Conjunctivae and EOM are normal. Right eye exhibits no discharge. Left eye exhibits no discharge. No scleral icterus.  Neck: Normal range of motion. Carotid bruit is not present. No erythema present. No thyromegaly present.  Cardiovascular: Normal rate, regular rhythm, normal heart sounds and normal pulses.   Pulmonary/Chest: Effort normal. No respiratory distress. She has no wheezes. Right breast exhibits  no mass, no nipple discharge, no skin change and no tenderness. Left breast exhibits no mass, no nipple discharge, no skin change and no tenderness.  Abdominal: Soft. Bowel sounds are normal. There is no hepatosplenomegaly. There is no tenderness. There is no CVA tenderness.  Musculoskeletal: Normal range of motion.  Lymphadenopathy:    She has no cervical adenopathy.    She has no axillary adenopathy.  Neurological: She is alert and oriented to person, place, and time. She has normal reflexes. No cranial nerve deficit or sensory deficit.  Skin: Skin is warm, dry and intact. No rash noted.  Redness on left heel - no drainage.  Reddened areas on tips of 2nd and 3rd toes on left  Psychiatric: She has a normal mood and affect. Her speech is normal and behavior is normal. Thought content normal.  Nursing note and vitals reviewed.   Activities of Daily Living In your present state of health, do you have any difficulty performing the following activities: 10/06/2016 08/26/2016  Hearing? N N  Vision? N N  Difficulty concentrating or making decisions? N N  Walking or climbing stairs? N N  Dressing or bathing? N N  Doing errands, shopping? N N  Preparing Food and eating ? N -  Using the Toilet? N -  In the past six months, have you accidently leaked urine? N -  Do you have problems with loss of bowel control? N -  Managing your Medications? N -  Managing your Finances? N -  Housekeeping or managing your Housekeeping? N -  Some recent data might be hidden    Fall Risk Assessment Fall Risk  10/06/2016 01/30/2016  Falls in the past year? No Yes  Number falls in past yr: - 2 or more  Injury with Fall? - Yes  Risk for fall due to : - History of fall(s);Other (Comment)  Follow up - Falls evaluation completed      Depression Screen PHQ 2/9 Scores 10/06/2016 01/30/2016  PHQ - 2 Score 4 0  PHQ- 9 Score 7 -    6CIT Screen 10/06/2016  What Year? 0 points  What month? 0 points  What time? 0  points  Count back from 20 0 points  Months in reverse 0 points  Repeat phrase 0 points  Total Score 0     Medicare Annual Wellness Visit Summary:  Reviewed patient's Family Medical History Reviewed and updated list of patient's medical providers Assessment of cognitive impairment was done Assessed patient's functional ability Established a written schedule for health screening [place] Completed and Reviewed  Exercise Activities and Dietary recommendations Goals    . Limit Drinking          Would like to cut back on drinking.        Immunization History  Administered Date(s) Administered  . Influenza,inj,Quad PF,36+ Mos 08/26/2016    Health Maintenance  Topic Date Due  . Hepatitis C Screening  [birth year]  . HIV Screening  01/17/1966  . TETANUS/TDAP  01/17/1970  . MAMMOGRAM  01/17/2001  . COLONOSCOPY  01/17/2001  . ZOSTAVAX  01/18/2011  . DEXA SCAN  01/18/2016  . PNA vac Low Risk Adult (1 of 2 - PCV13) 01/18/2016  . INFLUENZA VACCINE  Completed  . PAP SMEAR  Excluded    Discussed health benefits of physical activity, and encouraged her to engage in regular exercise appropriate for her age and condition.    ------------------------------------------------------------------------------------------------------------  Assessment & Plan:  1. Medicare annual wellness visit, subsequent Measures satisfied Encouraged to begin cutting back on alcohol intake She will schedule annual mammogram at [facility] - Lipid panel  2. Essential (primary) hypertension controlled - amLODipine-benazepril (LOTREL) 5-10 MG capsule; Take 1 capsule by mouth daily.  Dispense: 30 capsule; Refill: 5 - metoprolol succinate (TOPROL-XL) 50 MG 24 hr tablet; Take 1 tablet (50 mg total) by mouth daily. Take with or immediately following a meal.  Dispense: 30 tablet; Refill: 5 - Comprehensive metabolic panel - POCT urinalysis dipstick  3. Chronic respiratory failure with  hypoxia (HCC) Continue Symbicort - samples given Continue oxygen at home as needed  4. Thrombocytopenia (HCC) stable - CBC with Differential/Platelet  5. Peripheral arterial occlusive disease ([place]) With foot lesions - see Podiatry ASAP or follow up here if worsening Begin regular exercise when able - Comprehensive metabolic panel  6. Depression, major, single episode, in partial remission ([place]) Doing better on usual therapy - PARoxetine (PAXIL) 30 MG tablet; Take 1 tablet (30 mg total) by mouth daily.  Dispense: 30 tablet; Refill: 5 - TSH   Halina Maidens, MD [facility]  [facility]  10/06/2016

## 2016-10-07 LAB — COMPREHENSIVE METABOLIC PANEL
A/G RATIO: 1.4 (ref 1.2–2.2)
ALT: 21 IU/L (ref 0–32)
AST: 16 IU/L (ref 0–40)
Albumin: 4.6 g/dL (ref 3.6–4.8)
Alkaline Phosphatase: 71 IU/L (ref 39–117)
BILIRUBIN TOTAL: 0.5 mg/dL (ref 0.0–1.2)
BUN / CREAT RATIO: 10 — AB (ref 12–28)
BUN: 8 mg/dL (ref 8–27)
CALCIUM: 9.8 mg/dL (ref 8.7–10.3)
CHLORIDE: 93 mmol/L — AB (ref 96–106)
CO2: 23 mmol/L (ref 18–29)
Creatinine, Ser: 0.78 mg/dL (ref 0.57–1.00)
GFR, EST AFRICAN AMERICAN: 92 mL/min/{1.73_m2} (ref >59)
GFR, EST NON AFRICAN AMERICAN: 80 mL/min/{1.73_m2} (ref >59)
GLOBULIN, TOTAL: 3.2 g/dL (ref 1.5–4.5)
Glucose: 112 mg/dL — ABNORMAL HIGH (ref 65–99)
POTASSIUM: 4.9 mmol/L (ref 3.5–5.2)
SODIUM: 140 mmol/L (ref 134–144)
TOTAL PROTEIN: 7.8 g/dL (ref 6.0–8.5)

## 2016-10-07 LAB — CBC WITH DIFFERENTIAL/PLATELET
BASOS: 1 %
Basophils Absolute: 0.1 10*3/uL (ref 0.0–0.2)
EOS (ABSOLUTE): 0.2 10*3/uL (ref 0.0–0.4)
EOS: 2 %
Hematocrit: 51.1 % — ABNORMAL HIGH (ref 34.0–46.6)
Hemoglobin: 17.5 g/dL — ABNORMAL HIGH (ref 11.1–15.9)
IMMATURE GRANS (ABS): 0 10*3/uL (ref 0.0–0.1)
IMMATURE GRANULOCYTES: 0 %
LYMPHS: 25 %
Lymphocytes Absolute: 2.5 10*3/uL (ref 0.7–3.1)
MCH: 32.9 pg (ref 26.6–33.0)
MCHC: 34.2 g/dL (ref 31.5–35.7)
MCV: 96 fL (ref 79–97)
Monocytes Absolute: 0.8 10*3/uL (ref 0.1–0.9)
Monocytes: 8 %
NEUTROS PCT: 64 %
Neutrophils Absolute: 6.7 10*3/uL (ref 1.4–7.0)
PLATELETS: 218 10*3/uL (ref 150–379)
RBC: 5.32 x10E6/uL — ABNORMAL HIGH (ref 3.77–5.28)
RDW: 15.4 % (ref 12.3–15.4)
WBC: 10.2 10*3/uL (ref 3.4–10.8)

## 2016-10-07 LAB — LIPID PANEL
CHOL/HDL RATIO: 2.9 ratio (ref 0.0–4.4)
Cholesterol, Total: 247 mg/dL — ABNORMAL HIGH (ref 100–199)
HDL: 84 mg/dL (ref >39)
LDL Calculated: 144 mg/dL — ABNORMAL HIGH (ref 0–99)
Triglycerides: 93 mg/dL (ref 0–149)
VLDL Cholesterol Cal: 19 mg/dL (ref 5–40)

## 2016-10-07 LAB — TSH: TSH: 3.34 u[IU]/mL (ref 0.450–4.500)

## 2016-10-26 DIAGNOSIS — J449 Chronic obstructive pulmonary disease, unspecified: Secondary | ICD-10-CM | POA: Diagnosis not present

## 2016-11-06 ENCOUNTER — Encounter: Payer: Medicare Other | Admitting: Internal Medicine

## 2016-11-20 ENCOUNTER — Encounter: Payer: Medicare HMO | Admitting: Internal Medicine

## 2016-11-26 DIAGNOSIS — J449 Chronic obstructive pulmonary disease, unspecified: Secondary | ICD-10-CM | POA: Diagnosis not present

## 2016-12-04 ENCOUNTER — Encounter: Payer: Medicare Other | Admitting: Internal Medicine

## 2016-12-24 DIAGNOSIS — J449 Chronic obstructive pulmonary disease, unspecified: Secondary | ICD-10-CM | POA: Diagnosis not present

## 2017-01-06 ENCOUNTER — Encounter: Payer: Medicare Other | Admitting: Internal Medicine

## 2017-01-18 ENCOUNTER — Encounter: Payer: Self-pay | Admitting: Internal Medicine

## 2017-01-18 ENCOUNTER — Ambulatory Visit (INDEPENDENT_AMBULATORY_CARE_PROVIDER_SITE_OTHER): Payer: Medicare HMO | Admitting: Internal Medicine

## 2017-01-18 VITALS — BP 162/92 | HR 72 | Ht 65.0 in | Wt 157.0 lb

## 2017-01-18 DIAGNOSIS — N760 Acute vaginitis: Secondary | ICD-10-CM

## 2017-01-18 DIAGNOSIS — B9689 Other specified bacterial agents as the cause of diseases classified elsewhere: Secondary | ICD-10-CM

## 2017-01-18 LAB — POCT WET PREP WITH KOH
KOH Prep POC: NEGATIVE
Trichomonas, UA: NEGATIVE

## 2017-01-18 MED ORDER — CLINDAMYCIN HCL 300 MG PO CAPS: 300.0000 mg | ORAL_CAPSULE | Freq: Two times a day (BID) | ORAL | 0 refills | Status: DC

## 2017-01-18 NOTE — Progress Notes (Signed)
Date:  01/18/2017   Name:  Sandra Daniel   DOB:  [birth year]   MRN:  [id number]   Chief Complaint: Urinary Tract Infection (X 1 week- itching. Used monistat and no relief. Itching turned to burning and pain. Vagina is swollen. No discharge. ) Vaginal Injury  The patient's primary symptoms include genital itching and genital lesions (swelling and discomfort). The patient's pertinent negatives include no vaginal discharge. This is a new problem. The current episode started 1 to 4 weeks ago. The problem has been unchanged. The pain is mild. Associated symptoms include dysuria. Pertinent negatives include no chills, fever, flank pain, nausea or urgency. The symptoms are aggravated by tactile pressure. She has tried antifungals, warm baths and acetaminophen (used monostat first but then started burning.  Used vagisil and that made burning worse.) for the symptoms. The treatment provided no relief.      Review of Systems  Constitutional: Negative for chills, fatigue and fever.  Respiratory: Negative for chest tightness and shortness of breath.   Gastrointestinal: Negative for nausea.  Genitourinary: Positive for dysuria and genital sores. Negative for flank pain, urgency, vaginal bleeding and vaginal discharge.    Patient Active Problem List   Diagnosis Date Noted  . Macrocytosis without anemia 04/08/2016  . Erythrocytosis 04/08/2016  . Pre-ulcerative corn or callous 01/30/2016  . Peripheral arterial occlusive disease ([place]) 08/07/2015  . H/O amaurosis fugax 08/07/2015  . Below normal amount of sodium in the blood 07/10/2015  . Chronic respiratory failure with hypoxia ([place]) 07/10/2015  . AA (alcohol abuse) 07/08/2015  . Anxiety disorder 07/08/2015  . Essential (primary) hypertension 07/08/2015  . Blood glucose elevated 07/08/2015  . HLD (hyperlipidemia) 07/08/2015  . Depression, major, single episode, in partial remission ([place]) 07/08/2015  . H/O Malignant melanoma 07/08/2015  .  Idiopathic insomnia 07/08/2015  . Chronic obstructive pulmonary emphysema ([place]) 07/08/2015  . Avitaminosis D 07/08/2015  . Thrombocytopenia ([place]) 12/04/2013  . Tobacco use disorder 12/04/2013    Prior to Admission medications   Medication Sig Start Date End Date Taking? Authorizing Provider  albuterol (PROAIR HFA) 108 (90 Base) MCG/ACT inhaler Inhale 2 puffs into the lungs 4 (four) times daily as needed. 01/30/16  Yes Glean Hess, MD  ALPRAZolam Duanne Moron) 0.5 MG tablet Take 1 tablet (0.5 mg total) by mouth 2 (two) times daily. 08/26/16  Yes Glean Hess, MD  amLODipine-benazepril (LOTREL) 5-10 MG capsule Take 1 capsule by mouth daily. 10/06/16  Yes Glean Hess, MD  budesonide-formoterol [facility]) 160-4.5 MCG/ACT inhaler Inhale 2 puffs into the lungs 2 (two) times daily. 01/30/16 01/29/17 Yes Glean Hess, MD  metoprolol succinate (TOPROL-XL) 50 MG 24 hr tablet Take 1 tablet (50 mg total) by mouth daily. Take with or immediately following a meal. 10/06/16  Yes Glean Hess, MD  mirtazapine (REMERON) 15 MG tablet TAKE 1 TABLET AT BEDTIME 06/07/16  Yes Glean Hess, MD  PARoxetine (PAXIL) 30 MG tablet Take 1 tablet (30 mg total) by mouth daily. 10/06/16  Yes Glean Hess, MD    Allergies  Allergen Reactions  . Atorvastatin     myalgia  . Pristiq [Desvenlafaxine] Other (See Comments)    Numbness and worsening depression  . Lorazepam Anxiety    Past Surgical History:  Procedure Laterality Date  . ABDOMINAL HYSTERECTOMY    . Lumbar back surgery    . TONSILLECTOMY      Social History  Substance Use Topics  . Smoking status: Former Smoker  Quit date: 07/08/2015  . Smokeless tobacco: Never Used  . Alcohol use 0.0 oz/week     Comment: 12 beers per day    Medication list has been reviewed and updated.   Physical Exam  Constitutional: She is oriented to person, place, and time. She appears well-developed. She appears distressed.  HENT:  Head: Normocephalic  and atraumatic.  Cardiovascular: Normal rate, regular rhythm and normal heart sounds.   Pulmonary/Chest: Effort normal. No respiratory distress. She has decreased breath sounds. She has no wheezes.  Abdominal: Bowel sounds are normal. She exhibits distension. There is no tenderness.  Genitourinary: There is tenderness on the right labia. There is tenderness on the left labia. Vaginal discharge found.  Musculoskeletal: Normal range of motion.  Neurological: She is alert and oriented to person, place, and time.  Skin: Skin is warm and dry. No rash noted.  Psychiatric: She has a normal mood and affect. Her behavior is normal. Thought content normal.  Nursing note and vitals reviewed.  Microscopic wet-mount exam shows clue cells, excessive bacteria.  BP (!) 162/92 (BP Location: Right Arm, Patient Position: Sitting, Cuff Size: Normal)   Pulse 72   Ht 5\' 5"  (1.651 m)   Wt 157 lb (71.2 kg)   BMI 26.13 kg/m   Assessment and Plan: 1. BV (bacterial vaginosis) Diaper rash ointment externally to protect tissues - POCT Wet Prep with KOH   Meds ordered this encounter  Medications  . clindamycin (CLEOCIN) 300 MG capsule    Sig: Take 1 capsule (300 mg total) by mouth 2 (two) times daily.    Dispense:  14 capsule    Refill:  0    Halina Maidens, MD [facility]  01/19/2017

## 2017-01-18 NOTE — Patient Instructions (Signed)
Apply diaper rash ointment to external genitalia 3-4 times per day.

## 2017-01-24 DIAGNOSIS — J449 Chronic obstructive pulmonary disease, unspecified: Secondary | ICD-10-CM | POA: Diagnosis not present

## 2017-02-23 DIAGNOSIS — J449 Chronic obstructive pulmonary disease, unspecified: Secondary | ICD-10-CM | POA: Diagnosis not present

## 2017-03-17 ENCOUNTER — Other Ambulatory Visit: Payer: Self-pay | Admitting: Internal Medicine

## 2017-03-17 DIAGNOSIS — F411 Generalized anxiety disorder: Secondary | ICD-10-CM

## 2017-03-18 ENCOUNTER — Other Ambulatory Visit: Payer: Self-pay | Admitting: Internal Medicine

## 2017-03-18 DIAGNOSIS — F411 Generalized anxiety disorder: Secondary | ICD-10-CM

## 2017-03-18 NOTE — Telephone Encounter (Signed)
Gave two weeks, checked NCCSRS. Dr. Army Melia can refill the rest. If you need my DEA, give the [facility] office a call. Thanks!

## 2017-03-18 NOTE — Telephone Encounter (Signed)
Pt requesting controlled substance.

## 2017-03-23 NOTE — Telephone Encounter (Signed)
Requesting Xanax refill. 

## 2017-03-23 NOTE — Telephone Encounter (Signed)
Last assessed 08/2016, chronic anxiety. NCCSRS shows Dr. Army Melia as single provider, consistent pharmacy use and no early refills. Will give two weeks until Dr. Army Melia returns.

## 2017-03-26 DIAGNOSIS — J449 Chronic obstructive pulmonary disease, unspecified: Secondary | ICD-10-CM | POA: Diagnosis not present

## 2017-04-06 ENCOUNTER — Other Ambulatory Visit: Payer: Self-pay | Admitting: Internal Medicine

## 2017-04-06 DIAGNOSIS — F411 Generalized anxiety disorder: Secondary | ICD-10-CM

## 2017-04-12 ENCOUNTER — Ambulatory Visit: Payer: Medicare HMO | Admitting: Internal Medicine

## 2017-04-23 ENCOUNTER — Encounter: Payer: Self-pay | Admitting: Internal Medicine

## 2017-04-23 ENCOUNTER — Ambulatory Visit (INDEPENDENT_AMBULATORY_CARE_PROVIDER_SITE_OTHER): Payer: Medicare HMO | Admitting: Internal Medicine

## 2017-04-23 VITALS — BP 138/76 | HR 68 | Ht 65.0 in | Wt 157.0 lb

## 2017-04-23 DIAGNOSIS — R69 Illness, unspecified: Secondary | ICD-10-CM | POA: Diagnosis not present

## 2017-04-23 DIAGNOSIS — I779 Disorder of arteries and arterioles, unspecified: Secondary | ICD-10-CM

## 2017-04-23 DIAGNOSIS — R739 Hyperglycemia, unspecified: Secondary | ICD-10-CM

## 2017-04-23 DIAGNOSIS — J9611 Chronic respiratory failure with hypoxia: Secondary | ICD-10-CM | POA: Diagnosis not present

## 2017-04-23 DIAGNOSIS — J441 Chronic obstructive pulmonary disease with (acute) exacerbation: Secondary | ICD-10-CM

## 2017-04-23 DIAGNOSIS — I10 Essential (primary) hypertension: Secondary | ICD-10-CM | POA: Diagnosis not present

## 2017-04-23 DIAGNOSIS — F324 Major depressive disorder, single episode, in partial remission: Secondary | ICD-10-CM

## 2017-04-23 MED ORDER — MIRTAZAPINE 15 MG PO TABS: 15.0000 mg | ORAL_TABLET | Freq: Every day | ORAL | 5 refills | Status: DC

## 2017-04-23 MED ORDER — METOPROLOL SUCCINATE ER 50 MG PO TB24: 50.0000 mg | ORAL_TABLET | Freq: Every day | ORAL | 5 refills | Status: DC

## 2017-04-23 MED ORDER — PAROXETINE HCL 30 MG PO TABS: 30.0000 mg | ORAL_TABLET | Freq: Every day | ORAL | 5 refills | Status: DC

## 2017-04-23 MED ORDER — BUDESONIDE-FORMOTEROL FUMARATE 160-4.5 MCG/ACT IN AERO: 2.0000 | INHALATION_SPRAY | Freq: Two times a day (BID) | RESPIRATORY_TRACT | 5 refills | Status: AC

## 2017-04-23 MED ORDER — AMLODIPINE BESY-BENAZEPRIL HCL 5-10 MG PO CAPS: 1.0000 | ORAL_CAPSULE | Freq: Every day | ORAL | 5 refills | Status: DC

## 2017-04-23 NOTE — Progress Notes (Signed)
Date:  04/23/2017   Daniel:  Sandra Daniel   DOB:  01-31-1951   MRN:  [id number]   Chief Complaint: Hypertension (Needs refill on all meds except Xanax) and Depression (Refill on Remeron) Hypertension  This is a chronic problem. The problem is controlled. Associated symptoms include shortness of breath. Pertinent negatives include no chest pain, headaches or palpitations. Past treatments include beta blockers, calcium channel blockers and ACE inhibitors. The current treatment provides significant improvement.  Depression         This is a chronic problem.  Associated symptoms include fatigue and decreased interest.  Associated symptoms include no headaches, not sad and no suicidal ideas.  Past treatments include SSRIs - Selective serotonin reuptake inhibitors and other medications.  Compliance with treatment is good.  Previous treatment provided moderate relief.  Risk factors include alcohol intake.   Alcohol abuse - she continues to drink 12-15 beers per day.  She knows she needs to cut back but is struggling.  She also is struggling to eat a better diet.  She had elevated glucose last visit but has not been able to make changes. COPD - is now completely quit of tobacco.  Also using symbicort 2 puffs bid every day.  She feels that her SOB has improved but she still can not exercise.  She sent her O2 back since she was not using it.   Review of Systems  Constitutional: Positive for fatigue.  Respiratory: Positive for shortness of breath and wheezing. Negative for chest tightness.   Cardiovascular: Negative for chest pain and palpitations.  Gastrointestinal: Positive for abdominal distention. Negative for abdominal pain, diarrhea and vomiting.  Neurological: Negative for dizziness and headaches.  Psychiatric/Behavioral: Positive for depression and sleep disturbance. Negative for dysphoric mood and suicidal ideas. The patient is nervous/anxious.     Patient Active Problem List   Diagnosis  Date Noted  . Macrocytosis without anemia 04/08/2016  . Erythrocytosis 04/08/2016  . Pre-ulcerative corn or callous 01/30/2016  . Peripheral arterial occlusive disease ([place]) 08/07/2015  . H/O amaurosis fugax 08/07/2015  . Below normal amount of sodium in the blood 07/10/2015  . Chronic respiratory failure with hypoxia ([place]) 07/10/2015  . AA (alcohol abuse) 07/08/2015  . Anxiety disorder 07/08/2015  . Essential (primary) hypertension 07/08/2015  . Blood glucose elevated 07/08/2015  . HLD (hyperlipidemia) 07/08/2015  . Depression, major, single episode, in partial remission ([place]) 07/08/2015  . H/O Malignant melanoma 07/08/2015  . Idiopathic insomnia 07/08/2015  . Chronic obstructive pulmonary emphysema ([place]) 07/08/2015  . Avitaminosis D 07/08/2015  . Thrombocytopenia ([place]) 12/04/2013  . Tobacco use disorder 12/04/2013    Prior to Admission medications   Medication Sig Start Date End Date Taking? Authorizing Provider  albuterol (PROAIR HFA) 108 (90 Base) MCG/ACT inhaler Inhale 2 puffs into the lungs 4 (four) times daily as needed. 01/30/16  Yes Glean Hess, MD  ALPRAZolam Duanne Moron) 0.5 MG tablet TAKE 1 TABLET BY MOUTH 2 TIMES DAILY 04/06/17  Yes Glean Hess, MD  amLODipine-benazepril (LOTREL) 5-10 MG capsule Take 1 capsule by mouth daily. 10/06/16  Yes Glean Hess, MD  metoprolol succinate (TOPROL-XL) 50 MG 24 hr tablet Take 1 tablet (50 mg total) by mouth daily. Take with or immediately following a meal. 10/06/16  Yes Glean Hess, MD  mirtazapine (REMERON) 15 MG tablet TAKE 1 TABLET AT BEDTIME 06/07/16  Yes Glean Hess, MD  PARoxetine (PAXIL) 30 MG tablet Take 1 tablet (30 mg total) by mouth  daily. 10/06/16  Yes Glean Hess, MD  budesonide-formoterol [facility] 160-4.5 MCG/ACT inhaler Inhale 2 puffs into the lungs 2 (two) times daily. 01/30/16 01/29/17  Glean Hess, MD    Allergies  Allergen Reactions  . Atorvastatin     myalgia  . Pristiq  [Desvenlafaxine] Other (See Comments)    Numbness and worsening depression  . Lorazepam Anxiety    Past Surgical History:  Procedure Laterality Date  . ABDOMINAL HYSTERECTOMY    . Lumbar back surgery    . TONSILLECTOMY      Social History  Substance Use Topics  . Smoking status: Former Smoker    Quit date: 07/08/2015  . Smokeless tobacco: Never Used  . Alcohol use 0.0 oz/week     Comment: 12 beers per day     Medication list has been reviewed and updated.   Physical Exam  Constitutional: She is oriented to person, place, and time. She appears well-developed. No distress.  HENT:  Head: Normocephalic and atraumatic.  Cardiovascular: Normal rate, regular rhythm and normal heart sounds.   Pulmonary/Chest: Effort normal. No respiratory distress. She has decreased breath sounds. She has no wheezes.  Abdominal: Soft. Bowel sounds are normal. She exhibits distension (rounded). She exhibits no ascites and no mass. There is no hepatosplenomegaly. There is no tenderness.  Musculoskeletal: She exhibits no edema or tenderness.  Neurological: She is alert and oriented to person, place, and time.  Skin: Skin is warm and dry. No rash noted.  Psychiatric: She has a normal mood and affect. Her speech is normal and behavior is normal. Thought content normal.  Nursing note and vitals reviewed.   BP 138/76   Pulse 68   Ht 5\' 5"  (1.651 m)   Wt 157 lb (71.2 kg)   SpO2 (!) 89% Comment: They oxygen people picked up her oxygen machine last week...  BMI 26.13 kg/m   Assessment and Plan: 1. Peripheral arterial occlusive disease ([place]) Stable; unable to exercise but congratulated on quitting smoking  2. Essential (primary) hypertension controlled - metoprolol succinate (TOPROL-XL) 50 MG 24 hr tablet; Take 1 tablet (50 mg total) by mouth daily. Take with or immediately following a meal.  Dispense: 30 tablet; Refill: 5 - amLODipine-benazepril (LOTREL) 5-10 MG capsule; Take 1 capsule by mouth  daily.  Dispense: 30 capsule; Refill: 5 - Basic metabolic panel  3. Chronic respiratory failure with hypoxia (HCC) Stable; continue inhalers  4. Depression, major, single episode, in partial remission (HCC) Continue medications - PARoxetine (PAXIL) 30 MG tablet; Take 1 tablet (30 mg total) by mouth daily.  Dispense: 30 tablet; Refill: 5 - mirtazapine (REMERON) 15 MG tablet; Take 1 tablet (15 mg total) by mouth at bedtime.  Dispense: 30 tablet; Refill: 5  5. Blood glucose elevated Check labs; discussed cutting back on carbs, esp beer and sweets - Hemoglobin A1c  6. Chronic obstructive pulmonary disease with acute exacerbation (HCC) - budesonide-formoterol (SYMBICORT) 160-4.5 MCG/ACT inhaler; Inhale 2 puffs into the lungs 2 (two) times daily.  Dispense: 1 Inhaler; Refill: 5   Meds ordered this encounter  Medications  . PARoxetine (PAXIL) 30 MG tablet    Sig: Take 1 tablet (30 mg total) by mouth daily.    Dispense:  30 tablet    Refill:  5  . mirtazapine (REMERON) 15 MG tablet    Sig: Take 1 tablet (15 mg total) by mouth at bedtime.    Dispense:  30 tablet    Refill:  5  . metoprolol succinate (TOPROL-XL)  50 MG 24 hr tablet    Sig: Take 1 tablet (50 mg total) by mouth daily. Take with or immediately following a meal.    Dispense:  30 tablet    Refill:  5  . amLODipine-benazepril (LOTREL) 5-10 MG capsule    Sig: Take 1 capsule by mouth daily.    Dispense:  30 capsule    Refill:  5  . budesonide-formoterol (SYMBICORT) 160-4.5 MCG/ACT inhaler    Sig: Inhale 2 puffs into the lungs 2 (two) times daily.    Dispense:  1 Inhaler    Refill:  [place], MD [facility]  04/23/2017

## 2017-04-24 LAB — BASIC METABOLIC PANEL
BUN / CREAT RATIO: 11 — AB (ref 12–28)
BUN: 8 mg/dL (ref 8–27)
CHLORIDE: 93 mmol/L — AB (ref 96–106)
CO2: 27 mmol/L (ref 20–29)
Calcium: 9.7 mg/dL (ref 8.7–10.3)
Creatinine, Ser: 0.73 mg/dL (ref 0.57–1.00)
GFR calc Af Amer: 99 mL/min/{1.73_m2} (ref >59)
GFR calc non Af Amer: 86 mL/min/{1.73_m2} (ref >59)
GLUCOSE: 77 mg/dL (ref 65–99)
Potassium: 4.7 mmol/L (ref 3.5–5.2)
SODIUM: 138 mmol/L (ref 134–144)

## 2017-04-24 LAB — HEMOGLOBIN A1C
ESTIMATED AVERAGE GLUCOSE: 131 mg/dL
Hgb A1c MFr Bld: 6.2 % — ABNORMAL HIGH (ref 4.8–5.6)

## 2017-04-27 NOTE — Telephone Encounter (Signed)
done

## 2017-05-27 ENCOUNTER — Other Ambulatory Visit: Payer: Self-pay | Admitting: Internal Medicine

## 2017-05-27 DIAGNOSIS — J441 Chronic obstructive pulmonary disease with (acute) exacerbation: Secondary | ICD-10-CM

## 2017-06-08 ENCOUNTER — Telehealth: Payer: Self-pay | Admitting: Internal Medicine

## 2017-06-08 NOTE — Telephone Encounter (Signed)
Called pt to schedule for Annual Wellness Visit with Nurse Health Advisor, Cyr, October my c/b # is [phone]

## 2017-07-02 ENCOUNTER — Ambulatory Visit: Payer: Medicare HMO | Admitting: Internal Medicine

## 2017-07-27 ENCOUNTER — Telehealth: Payer: Self-pay | Admitting: Internal Medicine

## 2017-07-27 NOTE — Telephone Encounter (Signed)
Called pt to sched for Annual Wellness Visit with Nurse Health Advisor. C/b #  [phone]  Kathryn Brown ° °

## 2017-08-27 ENCOUNTER — Other Ambulatory Visit: Payer: Self-pay | Admitting: Internal Medicine

## 2017-08-27 DIAGNOSIS — F411 Generalized anxiety disorder: Secondary | ICD-10-CM

## 2017-09-10 ENCOUNTER — Other Ambulatory Visit: Payer: Self-pay | Admitting: Internal Medicine

## 2017-09-10 DIAGNOSIS — F411 Generalized anxiety disorder: Secondary | ICD-10-CM

## 2017-10-06 ENCOUNTER — Other Ambulatory Visit: Payer: Self-pay | Admitting: Internal Medicine

## 2017-10-06 ENCOUNTER — Telehealth: Payer: Self-pay | Admitting: Internal Medicine

## 2017-10-06 DIAGNOSIS — F411 Generalized anxiety disorder: Secondary | ICD-10-CM

## 2017-10-06 MED ORDER — ALPRAZOLAM 0.5 MG PO TABS: 0.5000 mg | ORAL_TABLET | Freq: Two times a day (BID) | ORAL | 0 refills | Status: DC

## 2017-10-06 NOTE — Telephone Encounter (Signed)
Needs some meds called in till her apt date on 10/12/17   ALPRAZolam (XANAX) 0.5 MG tablet [[id number]]   [place], [place] - [address]

## 2017-10-07 ENCOUNTER — Encounter: Payer: Medicare HMO | Admitting: Internal Medicine

## 2017-10-12 ENCOUNTER — Encounter: Payer: Self-pay | Admitting: Internal Medicine

## 2017-10-12 ENCOUNTER — Ambulatory Visit (INDEPENDENT_AMBULATORY_CARE_PROVIDER_SITE_OTHER): Payer: Medicare HMO | Admitting: Internal Medicine

## 2017-10-12 VITALS — BP 122/78 | HR 93 | Ht 65.0 in | Wt 161.0 lb

## 2017-10-12 DIAGNOSIS — F411 Generalized anxiety disorder: Secondary | ICD-10-CM

## 2017-10-12 DIAGNOSIS — I779 Disorder of arteries and arterioles, unspecified: Secondary | ICD-10-CM

## 2017-10-12 DIAGNOSIS — D696 Thrombocytopenia, unspecified: Secondary | ICD-10-CM | POA: Diagnosis not present

## 2017-10-12 DIAGNOSIS — F324 Major depressive disorder, single episode, in partial remission: Secondary | ICD-10-CM | POA: Diagnosis not present

## 2017-10-12 DIAGNOSIS — Z1231 Encounter for screening mammogram for malignant neoplasm of breast: Secondary | ICD-10-CM

## 2017-10-12 DIAGNOSIS — J439 Emphysema, unspecified: Secondary | ICD-10-CM | POA: Diagnosis not present

## 2017-10-12 DIAGNOSIS — R69 Illness, unspecified: Secondary | ICD-10-CM | POA: Diagnosis not present

## 2017-10-12 DIAGNOSIS — Z7689 Persons encountering health services in other specified circumstances: Secondary | ICD-10-CM | POA: Diagnosis not present

## 2017-10-12 DIAGNOSIS — Z0000 Encounter for general adult medical examination without abnormal findings: Secondary | ICD-10-CM | POA: Diagnosis not present

## 2017-10-12 DIAGNOSIS — J441 Chronic obstructive pulmonary disease with (acute) exacerbation: Secondary | ICD-10-CM

## 2017-10-12 DIAGNOSIS — I10 Essential (primary) hypertension: Secondary | ICD-10-CM

## 2017-10-12 DIAGNOSIS — F17201 Nicotine dependence, unspecified, in remission: Secondary | ICD-10-CM

## 2017-10-12 DIAGNOSIS — R7303 Prediabetes: Secondary | ICD-10-CM | POA: Diagnosis not present

## 2017-10-12 DIAGNOSIS — E782 Mixed hyperlipidemia: Secondary | ICD-10-CM

## 2017-10-12 DIAGNOSIS — Z1159 Encounter for screening for other viral diseases: Secondary | ICD-10-CM

## 2017-10-12 DIAGNOSIS — Z1211 Encounter for screening for malignant neoplasm of colon: Secondary | ICD-10-CM

## 2017-10-12 DIAGNOSIS — Z23 Encounter for immunization: Secondary | ICD-10-CM

## 2017-10-12 DIAGNOSIS — H903 Sensorineural hearing loss, bilateral: Secondary | ICD-10-CM | POA: Insufficient documentation

## 2017-10-12 LAB — POCT URINALYSIS DIPSTICK
BILIRUBIN UA: NEGATIVE
Blood, UA: NEGATIVE
GLUCOSE UA: NEGATIVE
KETONES UA: NEGATIVE
LEUKOCYTES UA: NEGATIVE
Nitrite, UA: NEGATIVE
Protein, UA: NEGATIVE
SPEC GRAV UA: 1.01 (ref 1.010–1.025)
Urobilinogen, UA: 0.2 E.U./dL
pH, UA: 5 (ref 5.0–8.0)

## 2017-10-12 MED ORDER — MIRTAZAPINE 15 MG PO TABS: 15.0000 mg | ORAL_TABLET | Freq: Every day | ORAL | 5 refills | Status: DC

## 2017-10-12 MED ORDER — ALBUTEROL SULFATE HFA 108 (90 BASE) MCG/ACT IN AERS: 2.0000 | INHALATION_SPRAY | Freq: Four times a day (QID) | RESPIRATORY_TRACT | 5 refills | Status: AC | PRN

## 2017-10-12 MED ORDER — PAROXETINE HCL 30 MG PO TABS: 30.0000 mg | ORAL_TABLET | Freq: Every day | ORAL | 5 refills | Status: AC

## 2017-10-12 MED ORDER — AMLODIPINE BESY-BENAZEPRIL HCL 5-10 MG PO CAPS: 1.0000 | ORAL_CAPSULE | Freq: Every day | ORAL | 5 refills | Status: AC

## 2017-10-12 MED ORDER — METOPROLOL SUCCINATE ER 50 MG PO TB24: 50.0000 mg | ORAL_TABLET | Freq: Every day | ORAL | 5 refills | Status: AC

## 2017-10-12 MED ORDER — ALPRAZOLAM 0.5 MG PO TABS: 0.5000 mg | ORAL_TABLET | Freq: Two times a day (BID) | ORAL | 5 refills | Status: DC

## 2017-10-12 NOTE — Patient Instructions (Signed)
Health Maintenance  Topic Date Due  . Hepatitis C Screening  Done today  . MAMMOGRAM  01/17/2001  . COLONOSCOPY  01/17/2001  . DEXA SCAN  01/18/2016  . PNA vac Low Risk Adult (1 of 2 - PCV13) 01/17/2017  . INFLUENZA VACCINE  05/26/2018  . TETANUS/TDAP  10/12/2018 (Originally 01/17/1970)   Trelegy - one inhalation daily

## 2017-10-12 NOTE — Progress Notes (Signed)
Patient: Sandra Daniel, Female    DOB: [birth year], 66 y.o.   MRN: [id number] Visit Date: 10/12/2017  Today's Provider: Halina Maidens, MD   Chief Complaint  Patient presents with  . Medicare Wellness    Breast Exam. Reg Dose Flu Shot.  . Hypertension  . COPD  . Depression   Subjective:    Annual wellness visit Sandra Daniel is a 66 y.o. female who presents today for her Subsequent Annual Wellness Visit. She feels fairly well. She reports exercising none. She reports she is sleeping fairly well.   ----------------------------------------------------------- Hypertension  This is a chronic problem. The problem is unchanged. The problem is controlled. Associated symptoms include shortness of breath. Pertinent negatives include no chest pain, headaches or palpitations. Past treatments include ACE inhibitors, calcium channel blockers and beta blockers.  Depression         This is a chronic problem.  The problem occurs intermittently.  Associated symptoms include no fatigue and no headaches.  Past treatments include other medications and SSRIs - Selective serotonin reuptake inhibitors.  Compliance with treatment is good. COPD - still using symbicort but gets short of breath with little exertion.  She did smoke again briefly for a few weeks but has stopped again.  She uses Albuterol MDI 4 times a day. PAD - no claudication or ulcers - likely due to limited activity from COPD   Review of Systems  Constitutional: Negative for chills, fatigue and fever.  HENT: Negative for congestion, hearing loss, tinnitus, trouble swallowing and voice change.   Eyes: Negative for visual disturbance.  Respiratory: Positive for shortness of breath. Negative for cough, chest tightness and wheezing.   Cardiovascular: Negative for chest pain, palpitations and leg swelling.  Gastrointestinal: Negative for abdominal pain, constipation, diarrhea and vomiting.  Endocrine: Negative for polydipsia and polyuria.    Genitourinary: Negative for dysuria, frequency, genital sores, vaginal bleeding and vaginal discharge.  Musculoskeletal: Negative for arthralgias, gait problem and joint swelling.  Skin: Negative for color change and rash.  Neurological: Negative for dizziness, tremors, light-headedness and headaches.  Hematological: Negative for adenopathy. Does not bruise/bleed easily.  Psychiatric/Behavioral: Positive for depression and dysphoric mood. Negative for sleep disturbance. The patient is not nervous/anxious.     Social History   Socioeconomic History  . Marital status: Widowed    Spouse name: Not on file  . Number of children: 1  . Years of education: Not on file  . Highest education level: Not on file  Social Needs  . Financial resource strain: Not hard at all  . Food insecurity - worry: Never true  . Food insecurity - inability: Never true  . Transportation needs - medical: No  . Transportation needs - non-medical: No  Occupational History  . Not on file  Tobacco Use  . Smoking status: Former Smoker    Last attempt to quit: 07/08/2015    Years since quitting: 2.2  . Smokeless tobacco: Never Used  Substance and Sexual Activity  . Alcohol use: Yes    Alcohol/week: 0.0 oz    Comment: 12 beers per day  . Drug use: No  . Sexual activity: Not on file  Other Topics Concern  . Not on file  Social History Narrative  . Not on file    Patient Active Problem List   Diagnosis Date Noted  . Bilateral sensorineural hearing loss 10/12/2017  . Macrocytosis without anemia 04/08/2016  . Erythrocytosis 04/08/2016  . Pre-ulcerative corn or callous 01/30/2016  . Peripheral  arterial occlusive disease ([place]) 08/07/2015  . H/O amaurosis fugax 08/07/2015  . AA (alcohol abuse) 07/08/2015  . Anxiety disorder 07/08/2015  . Essential (primary) hypertension 07/08/2015  . HLD (hyperlipidemia) 07/08/2015  . Depression, major, single episode, in partial remission ([place]) 07/08/2015  . H/O Malignant  melanoma 07/08/2015  . Idiopathic insomnia 07/08/2015  . Chronic obstructive pulmonary emphysema ([place]) 07/08/2015  . Avitaminosis D 07/08/2015  . Thrombocytopenia ([place]) 12/04/2013  . Tobacco use disorder 12/04/2013    Past Surgical History:  Procedure Laterality Date  . ABDOMINAL HYSTERECTOMY    . Lumbar back surgery    . TONSILLECTOMY      Her family history includes CAD in her father; Heart attack in her father; Stroke in her mother, paternal aunt, paternal grandmother, and paternal uncle; Vascular Disease in her paternal grandfather.     Current Meds  Medication Sig  . ALPRAZolam (XANAX) 0.5 MG tablet Take 1 tablet (0.5 mg total) by mouth 2 (two) times daily.  Marland Kitchen amLODipine-benazepril (LOTREL) 5-10 MG capsule Take 1 capsule by mouth daily.  . budesonide-formoterol (SYMBICORT) 160-4.5 MCG/ACT inhaler Inhale 2 puffs into the lungs 2 (two) times daily.  . metoprolol succinate (TOPROL-XL) 50 MG 24 hr tablet Take 1 tablet (50 mg total) by mouth daily. Take with or immediately following a meal.  . mirtazapine (REMERON) 15 MG tablet Take 1 tablet (15 mg total) by mouth at bedtime.  Marland Kitchen PARoxetine (PAXIL) 30 MG tablet Take 1 tablet (30 mg total) by mouth daily.  . VENTOLIN HFA 108 (90 Base) MCG/ACT inhaler TAKE 2 INHALATIONS INTO THE LUNGS 4 TIMES DAILY AS NEEDED    Patient Care Team: Glean Hess, MD as PCP - General (Family Medicine)       Objective:   Vitals: BP 122/78   Pulse 93   Ht 5\' 5"  (1.651 m)   Wt 161 lb (73 kg)   SpO2 91%   BMI 26.79 kg/m   Physical Exam  Constitutional: She is oriented to person, place, and time. She appears well-developed and well-nourished. No distress.  HENT:  Head: Normocephalic and atraumatic.  Right Ear: Tympanic membrane and ear canal normal.  Left Ear: Tympanic membrane and ear canal normal.  Nose: Right sinus exhibits no maxillary sinus tenderness. Left sinus exhibits no maxillary sinus tenderness.  Mouth/Throat: Uvula is midline  and oropharynx is clear and moist.  Eyes: Conjunctivae and EOM are normal. Right eye exhibits no discharge. Left eye exhibits no discharge. No scleral icterus.  Neck: Normal range of motion. Carotid bruit is not present. No erythema present. No thyromegaly present.  Cardiovascular: Normal rate, regular rhythm and normal heart sounds.  Pulses:      Dorsalis pedis pulses are 1+ on the right side, and 1+ on the left side.       Posterior tibial pulses are 0 on the right side, and 0 on the left side.  Pulmonary/Chest: Effort normal. No respiratory distress. She has decreased breath sounds. She has no wheezes. She has no rhonchi. Right breast exhibits no mass, no nipple discharge, no skin change and no tenderness. Left breast exhibits no mass, no nipple discharge, no skin change and no tenderness.  Abdominal: Soft. Bowel sounds are normal. There is no hepatosplenomegaly. There is no tenderness. There is no CVA tenderness.  Musculoskeletal: Normal range of motion. She exhibits no edema or tenderness.  Lymphadenopathy:    She has no cervical adenopathy.    She has no axillary adenopathy.  Neurological: She is alert  and oriented to person, place, and time. She has normal reflexes. No cranial nerve deficit or sensory deficit.  Skin: Skin is warm, dry and intact. No rash noted.  Psychiatric: She has a normal mood and affect. Her speech is normal and behavior is normal. Thought content normal.  Nursing note and vitals reviewed.   Activities of Daily Living In your present state of health, do you have any difficulty performing the following activities: 10/12/2017  Hearing? N  Vision? N  Difficulty concentrating or making decisions? N  Walking or climbing stairs? Y  Dressing or bathing? N  Doing errands, shopping? N  Preparing Food and eating ? N  Using the Toilet? N  In the past six months, have you accidently leaked urine? N  Do you have problems with loss of bowel control? N  Managing your  Medications? N  Managing your Finances? N  Housekeeping or managing your Housekeeping? N  Some recent data might be hidden    Fall Risk Assessment Fall Risk  10/12/2017 10/06/2016 01/30/2016  Falls in the past year? No No Yes  Number falls in past yr: - - 2 or more  Injury with Fall? - - Yes  Risk for fall due to : - - History of fall(s);Other (Comment)  Follow up - - Falls evaluation completed     Depression Screen PHQ 2/9 Scores 10/12/2017 04/23/2017 10/06/2016 01/30/2016  PHQ - 2 Score 4 0 4 0  PHQ- 9 Score 13 - 7 -    6CIT Screen 10/12/2017 10/06/2016  What Year? 0 points 0 points  What month? 0 points 0 points  What time? 0 points 0 points  Count back from 20 0 points 0 points  Months in reverse 0 points 0 points  Repeat phrase 0 points 0 points  Total Score 0 0    Medicare Annual Wellness Visit Summary:  Reviewed patient's Family Medical History Reviewed and updated list of patient's medical providers Assessment of cognitive impairment was done Assessed patient's functional ability Established a written schedule for health screening [place] Completed and Reviewed  Exercise Activities and Dietary recommendations Goals    None      Immunization History  Administered Date(s) Administered  . Influenza,inj,Quad PF,6+ Mos 08/26/2016    Health Maintenance  Topic Date Due  . Hepatitis C Screening  October 09, 1951  . MAMMOGRAM  01/17/2001  . COLONOSCOPY  01/17/2001  . DEXA SCAN  01/18/2016  . PNA vac Low Risk Adult (1 of 2 - PCV13) 01/18/2016  . INFLUENZA VACCINE  05/26/2017  . TETANUS/TDAP  10/12/2018 (Originally 01/17/1970)    Discussed health benefits of physical activity, and encouraged her to engage in regular exercise appropriate for her age and condition.    ------------------------------------------------------------------------------------------------------------  Assessment & Plan:   1. Medicare annual wellness visit,  subsequent Measures satisfied - DG Bone Density  2. Thrombocytopenia ([place]) Check labs  3. Essential (primary) hypertension controlled - CBC with Differential/Platelet - Comprehensive metabolic panel - TSH - POCT urinalysis dipstick - metoprolol succinate (TOPROL-XL) 50 MG 24 hr tablet; Take 1 tablet (50 mg total) by mouth daily. Take with or immediately following a meal.  Dispense: 30 tablet; Refill: 5 - amLODipine-benazepril (LOTREL) 5-10 MG capsule; Take 1 capsule by mouth daily.  Dispense: 30 capsule; Refill: 5  4. Peripheral arterial occlusive disease (HCC) Stable,   5. Pulmonary emphysema, unspecified emphysema type ([place]) Samples of Trelegy given in place of symbicort  6. Generalized anxiety disorder - ALPRAZolam (XANAX) 0.5  MG tablet; Take 1 tablet (0.5 mg total) by mouth 2 (two) times daily.  Dispense: 60 tablet; Refill: 5  7. Depression, major, single episode, in partial remission ([place]) Slightly worse with recent family events Continue current medication and return if needed - PARoxetine (PAXIL) 30 MG tablet; Take 1 tablet (30 mg total) by mouth daily.  Dispense: 30 tablet; Refill: 5 - mirtazapine (REMERON) 15 MG tablet; Take 1 tablet (15 mg total) by mouth at bedtime.  Dispense: 30 tablet; Refill: 5  8. Tobacco use disorder Pt encouraged to remain free of tobacco  9. Mixed hyperlipidemia - Lipid panel  10. Need for pneumococcal vaccination Will order Prevnar at next visit  66. Encounter for screening mammogram for breast cancer - MM DIGITAL SCREENING BILATERAL  12. Need for hepatitis C screening test - Hepatitis C antibody  13. Colon cancer screening Pt continues to decline screening  14. Need for immunization against influenza - Flu Vaccine QUAD 36+ mos IM  15. Chronic obstructive pulmonary disease with acute exacerbation (HCC) - albuterol (VENTOLIN HFA) 108 (90 Base) MCG/ACT inhaler; Inhale 2 puffs into the lungs every 6 (six) hours as needed for  wheezing or shortness of breath.  Dispense: 18 g; Refill: 5   Meds ordered this encounter  Medications  . albuterol (VENTOLIN HFA) 108 (90 Base) MCG/ACT inhaler    Sig: Inhale 2 puffs into the lungs every 6 (six) hours as needed for wheezing or shortness of breath.    Dispense:  18 g    Refill:  5  . PARoxetine (PAXIL) 30 MG tablet    Sig: Take 1 tablet (30 mg total) by mouth daily.    Dispense:  30 tablet    Refill:  5  . mirtazapine (REMERON) 15 MG tablet    Sig: Take 1 tablet (15 mg total) by mouth at bedtime.    Dispense:  30 tablet    Refill:  5  . metoprolol succinate (TOPROL-XL) 50 MG 24 hr tablet    Sig: Take 1 tablet (50 mg total) by mouth daily. Take with or immediately following a meal.    Dispense:  30 tablet    Refill:  5  . amLODipine-benazepril (LOTREL) 5-10 MG capsule    Sig: Take 1 capsule by mouth daily.    Dispense:  30 capsule    Refill:  5  . ALPRAZolam (XANAX) 0.5 MG tablet    Sig: Take 1 tablet (0.5 mg total) by mouth 2 (two) times daily.    Dispense:  60 tablet    Refill:  5    Partially dictated using Editor, commissioning. Any errors are unintentional.  Halina Maidens, MD [facility]  10/12/2017

## 2017-10-13 LAB — COMPREHENSIVE METABOLIC PANEL
ALBUMIN: 4.5 g/dL (ref 3.6–4.8)
ALK PHOS: 72 IU/L (ref 39–117)
ALT: 21 IU/L (ref 0–32)
AST: 19 IU/L (ref 0–40)
Albumin/Globulin Ratio: 1.7 (ref 1.2–2.2)
BUN / CREAT RATIO: 14 (ref 12–28)
BUN: 10 mg/dL (ref 8–27)
Bilirubin Total: 0.5 mg/dL (ref 0.0–1.2)
CO2: 26 mmol/L (ref 20–29)
CREATININE: 0.72 mg/dL (ref 0.57–1.00)
Calcium: 9.5 mg/dL (ref 8.7–10.3)
Chloride: 96 mmol/L (ref 96–106)
GFR calc Af Amer: 101 mL/min/{1.73_m2} (ref >59)
GFR calc non Af Amer: 88 mL/min/{1.73_m2} (ref >59)
GLUCOSE: 120 mg/dL — AB (ref 65–99)
Globulin, Total: 2.6 g/dL (ref 1.5–4.5)
Potassium: 5.2 mmol/L (ref 3.5–5.2)
Sodium: 138 mmol/L (ref 134–144)
TOTAL PROTEIN: 7.1 g/dL (ref 6.0–8.5)

## 2017-10-13 LAB — CBC WITH DIFFERENTIAL/PLATELET
BASOS ABS: 0 10*3/uL (ref 0.0–0.2)
Basos: 0 %
EOS (ABSOLUTE): 0.2 10*3/uL (ref 0.0–0.4)
Eos: 2 %
Hematocrit: 50.5 % — ABNORMAL HIGH (ref 34.0–46.6)
Hemoglobin: 16.9 g/dL — ABNORMAL HIGH (ref 11.1–15.9)
IMMATURE GRANS (ABS): 0 10*3/uL (ref 0.0–0.1)
IMMATURE GRANULOCYTES: 0 %
LYMPHS: 17 %
Lymphocytes Absolute: 1.8 10*3/uL (ref 0.7–3.1)
MCH: 33.7 pg — ABNORMAL HIGH (ref 26.6–33.0)
MCHC: 33.5 g/dL (ref 31.5–35.7)
MCV: 101 fL — ABNORMAL HIGH (ref 79–97)
MONOCYTES: 6 %
Monocytes Absolute: 0.7 10*3/uL (ref 0.1–0.9)
NEUTROS PCT: 75 %
Neutrophils Absolute: 8.1 10*3/uL — ABNORMAL HIGH (ref 1.4–7.0)
PLATELETS: 238 10*3/uL (ref 150–379)
RBC: 5.02 x10E6/uL (ref 3.77–5.28)
RDW: 13.3 % (ref 12.3–15.4)
WBC: 10.9 10*3/uL — ABNORMAL HIGH (ref 3.4–10.8)

## 2017-10-13 LAB — LIPID PANEL
Chol/HDL Ratio: 2.5 ratio (ref 0.0–4.4)
Cholesterol, Total: 190 mg/dL (ref 100–199)
HDL: 77 mg/dL (ref >39)
LDL Calculated: 97 mg/dL (ref 0–99)
TRIGLYCERIDES: 81 mg/dL (ref 0–149)
VLDL CHOLESTEROL CAL: 16 mg/dL (ref 5–40)

## 2017-10-13 LAB — TSH: TSH: 2.59 u[IU]/mL (ref 0.450–4.500)

## 2017-10-13 LAB — HEPATITIS C ANTIBODY: HEP C VIRUS AB: 0.1 {s_co_ratio} (ref 0.0–0.9)

## 2017-10-22 ENCOUNTER — Telehealth: Payer: Self-pay

## 2017-10-22 ENCOUNTER — Encounter: Payer: Self-pay | Admitting: Internal Medicine

## 2017-10-22 NOTE — Telephone Encounter (Signed)
Patient stated she received Jury Duty but needs a note from PCP stating she can not sit due to medical reasons. Letter needs to have PCP office letterhead. Please Advise.

## 2017-10-27 ENCOUNTER — Other Ambulatory Visit: Payer: Self-pay | Admitting: Internal Medicine

## 2017-10-27 DIAGNOSIS — F411 Generalized anxiety disorder: Secondary | ICD-10-CM

## 2017-11-05 ENCOUNTER — Encounter: Payer: Self-pay | Admitting: Internal Medicine

## 2017-11-05 DIAGNOSIS — R7303 Prediabetes: Secondary | ICD-10-CM | POA: Insufficient documentation

## 2017-11-05 LAB — SPECIMEN STATUS REPORT

## 2017-11-05 LAB — HGB A1C W/O EAG: Hgb A1c MFr Bld: 6.4 % — ABNORMAL HIGH (ref 4.8–5.6)

## 2017-11-11 ENCOUNTER — Other Ambulatory Visit: Payer: Self-pay | Admitting: Internal Medicine

## 2017-11-11 DIAGNOSIS — F411 Generalized anxiety disorder: Secondary | ICD-10-CM

## 2017-11-12 ENCOUNTER — Telehealth: Payer: Self-pay

## 2017-11-12 NOTE — Telephone Encounter (Signed)
Patient was seen on 10-22-17 and was given paper Rx for Xanax at that appointment. She has lost the prescription. Stated she did not have it filled at the time because she "already had enough Xanax for the time being." Needs new rx and would like faxed to [facility] in [place].   Please Advise.

## 2017-11-19 ENCOUNTER — Other Ambulatory Visit: Payer: Self-pay | Admitting: Internal Medicine

## 2017-11-19 DIAGNOSIS — F411 Generalized anxiety disorder: Secondary | ICD-10-CM

## 2018-01-19 ENCOUNTER — Ambulatory Visit: Payer: Medicare HMO | Admitting: Internal Medicine

## 2018-01-20 ENCOUNTER — Ambulatory Visit: Payer: Medicare HMO | Admitting: Internal Medicine

## 2018-02-03 DIAGNOSIS — L03116 Cellulitis of left lower limb: Secondary | ICD-10-CM | POA: Diagnosis not present

## 2018-02-03 DIAGNOSIS — L97401 Non-pressure chronic ulcer of unspecified heel and midfoot limited to breakdown of skin: Secondary | ICD-10-CM | POA: Diagnosis not present

## 2018-02-03 DIAGNOSIS — L03115 Cellulitis of right lower limb: Secondary | ICD-10-CM | POA: Diagnosis not present

## 2018-02-03 DIAGNOSIS — M2010 Hallux valgus (acquired), unspecified foot: Secondary | ICD-10-CM | POA: Diagnosis not present

## 2018-02-03 DIAGNOSIS — B351 Tinea unguium: Secondary | ICD-10-CM | POA: Diagnosis not present

## 2018-02-15 DIAGNOSIS — B351 Tinea unguium: Secondary | ICD-10-CM | POA: Diagnosis not present

## 2018-02-17 DIAGNOSIS — M2010 Hallux valgus (acquired), unspecified foot: Secondary | ICD-10-CM | POA: Diagnosis not present

## 2018-02-17 DIAGNOSIS — L97419 Non-pressure chronic ulcer of right heel and midfoot with unspecified severity: Secondary | ICD-10-CM | POA: Diagnosis not present

## 2018-02-19 DIAGNOSIS — R9431 Abnormal electrocardiogram [ECG] [EKG]: Secondary | ICD-10-CM | POA: Diagnosis not present

## 2018-02-19 DIAGNOSIS — I445 Left posterior fascicular block: Secondary | ICD-10-CM | POA: Diagnosis not present

## 2018-02-19 DIAGNOSIS — Y903 Blood alcohol level of 60-79 mg/100 ml: Secondary | ICD-10-CM | POA: Diagnosis not present

## 2018-02-19 DIAGNOSIS — Z79899 Other long term (current) drug therapy: Secondary | ICD-10-CM | POA: Diagnosis not present

## 2018-02-19 DIAGNOSIS — R69 Illness, unspecified: Secondary | ICD-10-CM | POA: Diagnosis not present

## 2018-02-19 DIAGNOSIS — J441 Chronic obstructive pulmonary disease with (acute) exacerbation: Secondary | ICD-10-CM | POA: Diagnosis not present

## 2018-02-19 DIAGNOSIS — F1010 Alcohol abuse, uncomplicated: Secondary | ICD-10-CM | POA: Diagnosis not present

## 2018-02-19 DIAGNOSIS — I517 Cardiomegaly: Secondary | ICD-10-CM | POA: Diagnosis not present

## 2018-02-19 DIAGNOSIS — R531 Weakness: Secondary | ICD-10-CM | POA: Diagnosis not present

## 2018-02-19 DIAGNOSIS — J449 Chronic obstructive pulmonary disease, unspecified: Secondary | ICD-10-CM | POA: Diagnosis not present

## 2018-02-19 DIAGNOSIS — R0602 Shortness of breath: Secondary | ICD-10-CM | POA: Diagnosis not present

## 2018-03-18 DIAGNOSIS — J9601 Acute respiratory failure with hypoxia: Secondary | ICD-10-CM | POA: Diagnosis not present

## 2018-03-18 DIAGNOSIS — J441 Chronic obstructive pulmonary disease with (acute) exacerbation: Secondary | ICD-10-CM | POA: Diagnosis not present

## 2018-03-18 DIAGNOSIS — R0902 Hypoxemia: Secondary | ICD-10-CM | POA: Diagnosis not present

## 2018-03-18 DIAGNOSIS — F061 Catatonic disorder due to known physiological condition: Secondary | ICD-10-CM | POA: Diagnosis not present

## 2018-03-18 DIAGNOSIS — Z720 Tobacco use: Secondary | ICD-10-CM | POA: Diagnosis not present

## 2018-03-18 DIAGNOSIS — R1312 Dysphagia, oropharyngeal phase: Secondary | ICD-10-CM | POA: Diagnosis not present

## 2018-03-18 DIAGNOSIS — R4789 Other speech disturbances: Secondary | ICD-10-CM | POA: Diagnosis not present

## 2018-03-18 DIAGNOSIS — I2699 Other pulmonary embolism without acute cor pulmonale: Secondary | ICD-10-CM | POA: Diagnosis not present

## 2018-03-18 DIAGNOSIS — E871 Hypo-osmolality and hyponatremia: Secondary | ICD-10-CM | POA: Diagnosis not present

## 2018-03-18 DIAGNOSIS — R479 Unspecified speech disturbances: Secondary | ICD-10-CM | POA: Diagnosis not present

## 2018-03-18 DIAGNOSIS — F411 Generalized anxiety disorder: Secondary | ICD-10-CM | POA: Diagnosis not present

## 2018-03-18 DIAGNOSIS — J440 Chronic obstructive pulmonary disease with acute lower respiratory infection: Secondary | ICD-10-CM | POA: Diagnosis not present

## 2018-03-18 DIAGNOSIS — J159 Unspecified bacterial pneumonia: Secondary | ICD-10-CM | POA: Diagnosis not present

## 2018-03-18 DIAGNOSIS — L03115 Cellulitis of right lower limb: Secondary | ICD-10-CM | POA: Diagnosis not present

## 2018-03-18 DIAGNOSIS — G459 Transient cerebral ischemic attack, unspecified: Secondary | ICD-10-CM | POA: Diagnosis not present

## 2018-03-18 DIAGNOSIS — F05 Delirium due to known physiological condition: Secondary | ICD-10-CM | POA: Diagnosis not present

## 2018-03-18 DIAGNOSIS — I10 Essential (primary) hypertension: Secondary | ICD-10-CM | POA: Diagnosis not present

## 2018-03-18 DIAGNOSIS — J9611 Chronic respiratory failure with hypoxia: Secondary | ICD-10-CM | POA: Diagnosis not present

## 2018-03-18 DIAGNOSIS — R69 Illness, unspecified: Secondary | ICD-10-CM | POA: Diagnosis not present

## 2018-03-18 DIAGNOSIS — R0602 Shortness of breath: Secondary | ICD-10-CM | POA: Diagnosis not present

## 2018-03-30 MED ORDER — TRAZODONE HCL 50 MG PO TABS
50.00 | ORAL_TABLET | ORAL | Status: DC
Start: ? — End: 2018-03-30

## 2018-03-30 MED ORDER — BUDESONIDE-FORMOTEROL FUMARATE 160-4.5 MCG/ACT IN AERO
2.00 | INHALATION_SPRAY | RESPIRATORY_TRACT | Status: DC
Start: 2018-03-30 — End: 2018-03-30

## 2018-03-30 MED ORDER — PAROXETINE HCL 20 MG PO TABS
40.00 | ORAL_TABLET | ORAL | Status: DC
Start: 2018-03-31 — End: 2018-03-30

## 2018-03-30 MED ORDER — BISACODYL 10 MG RE SUPP
10.00 | RECTAL | Status: DC
Start: ? — End: 2018-03-30

## 2018-03-30 MED ORDER — ASPIRIN 81 MG PO CHEW
81.00 | CHEWABLE_TABLET | ORAL | Status: DC
Start: 2018-03-31 — End: 2018-03-30

## 2018-03-30 MED ORDER — ALUM & MAG HYDROXIDE-SIMETH 400-400-40 MG/5ML PO SUSP
15.00 | ORAL | Status: DC
Start: ? — End: 2018-03-30

## 2018-03-30 MED ORDER — MIRTAZAPINE 15 MG PO TABS
30.00 | ORAL_TABLET | ORAL | Status: DC
Start: 2018-03-30 — End: 2018-03-30

## 2018-03-30 MED ORDER — THIAMINE HCL 100 MG PO TABS
100.00 | ORAL_TABLET | ORAL | Status: DC
Start: 2018-03-30 — End: 2018-03-30

## 2018-03-30 MED ORDER — GENERIC EXTERNAL MEDICATION
Status: DC
Start: ? — End: 2018-03-30

## 2018-03-30 MED ORDER — ACETAMINOPHEN 325 MG PO TABS
650.00 | ORAL_TABLET | ORAL | Status: DC
Start: ? — End: 2018-03-30

## 2018-03-30 MED ORDER — GENERIC EXTERNAL MEDICATION
1.00 | Status: DC
Start: ? — End: 2018-03-30

## 2018-03-30 MED ORDER — LIDOCAINE HCL 1 % IJ SOLN
0.50 | INTRAMUSCULAR | Status: DC
Start: ? — End: 2018-03-30

## 2018-03-30 MED ORDER — SODIUM CHLORIDE 0.9 % IJ SOLN
5.00 | INTRAMUSCULAR | Status: DC
Start: 2018-03-30 — End: 2018-03-30

## 2018-03-30 MED ORDER — DEXTROSE 50 % IV SOLN
12.50 | INTRAVENOUS | Status: DC
Start: ? — End: 2018-03-30

## 2018-03-30 MED ORDER — NICOTINE 7 MG/24HR TD PT24
1.00 | MEDICATED_PATCH | TRANSDERMAL | Status: DC
Start: 2018-03-31 — End: 2018-03-30

## 2018-03-30 MED ORDER — ALBUTEROL SULFATE (2.5 MG/3ML) 0.083% IN NEBU
2.50 | INHALATION_SOLUTION | RESPIRATORY_TRACT | Status: DC
Start: ? — End: 2018-03-30

## 2018-03-30 MED ORDER — TRAZODONE HCL 50 MG PO TABS
25.00 | ORAL_TABLET | ORAL | Status: DC
Start: ? — End: 2018-03-30

## 2018-03-30 MED ORDER — OLANZAPINE 10 MG PO TABS
5.00 | ORAL_TABLET | ORAL | Status: DC
Start: 2018-03-30 — End: 2018-03-30

## 2018-03-30 MED ORDER — OLANZAPINE 2.5 MG PO TABS
2.50 | ORAL_TABLET | ORAL | Status: DC
Start: ? — End: 2018-03-30

## 2018-03-30 MED ORDER — BISACODYL 5 MG PO TBEC
10.00 | DELAYED_RELEASE_TABLET | ORAL | Status: DC
Start: ? — End: 2018-03-30

## 2018-03-30 MED ORDER — AMLODIPINE BESYLATE 10 MG PO TABS
10.00 | ORAL_TABLET | ORAL | Status: DC
Start: 2018-03-31 — End: 2018-03-30

## 2018-03-30 MED ORDER — GABAPENTIN 100 MG PO CAPS
100.00 | ORAL_CAPSULE | ORAL | Status: DC
Start: 2018-03-30 — End: 2018-03-30

## 2018-03-30 MED ORDER — LIDOCAINE HCL (PF) 1 % IJ SOLN
0.50 | INTRAMUSCULAR | Status: DC
Start: ? — End: 2018-03-30

## 2018-03-30 MED ORDER — IPRATROPIUM BROMIDE 0.02 % IN SOLN
.50 | RESPIRATORY_TRACT | Status: DC
Start: ? — End: 2018-03-30

## 2018-03-30 MED ORDER — WARFARIN SODIUM 2 MG PO TABS
4.00 | ORAL_TABLET | ORAL | Status: DC
Start: 2018-03-30 — End: 2018-03-30

## 2018-03-30 MED ORDER — GUAIFENESIN ER 600 MG PO TB12
600.00 | ORAL_TABLET | ORAL | Status: DC
Start: 2018-03-30 — End: 2018-03-30

## 2018-03-31 ENCOUNTER — Telehealth: Payer: Self-pay

## 2018-03-31 DIAGNOSIS — R0902 Hypoxemia: Secondary | ICD-10-CM | POA: Diagnosis not present

## 2018-03-31 NOTE — Telephone Encounter (Signed)
TOC #1. Called pt to f/u after d/c from [facility] on 03/30/18. Also wanted to confirm her hosp f/u appt w/ Dr. Army Melia on 04/06/18 @ 3:30pm. Discharge planning includes the following:  - D/C home with [facility] - Check INR and BMP as outpatient - Complete f/u CT chest within 6-8 weeks - Discontinue Xanax, Zithromax, Cleocin and Prednisone As part of the TOC f/u call, I am also wanting to discuss/review the above information with the pt to ensure all of the above have been taken care of and to ensure she doesn't have any questions or concerns re: her care/discharge instructions. Unable to LVM d/t phone is not accepting incoming calls at this time.

## 2018-04-01 ENCOUNTER — Telehealth: Payer: Self-pay

## 2018-04-01 ENCOUNTER — Other Ambulatory Visit: Payer: Self-pay

## 2018-04-01 NOTE — Telephone Encounter (Signed)
TOC #2. Called pt to f/u after d/c from [place] on 03/30/18. Also wanted to confirm her hosp f/u appt w/ Dr. Army Melia on 04/06/18 @ 3:30pm. Discharge planning includes the following:  - D/C home with [facility] - Check INR and BMP as outpatient - Complete f/u CT chest within 6-8 weeks - Discontinue Xanax, Zithromax, Cleocin and Prednisone As part of the TOC f/u call, I am also wanting to discuss/review the above information with the pt to ensure all of the above have been taken care of and to ensure she doesn't have any questions or concerns re: her care/discharge instructions. Unable to LVM d/t phone is not accepting incoming calls at this time.

## 2018-04-02 IMAGING — US US ABDOMEN COMPLETE
1 series · 13 of 25 positions shown · non-contrast
Comparison: None in PACs

CLINICAL DATA: Polycythemia, elevated over throw peau it and
levels, daily alcohol intake.

EXAM:
ABDOMEN ULTRASOUND COMPLETE

[Series 1: us abdomen complete · 0.25mm/px · 13 of 101 slices shown]
[im 1/101]
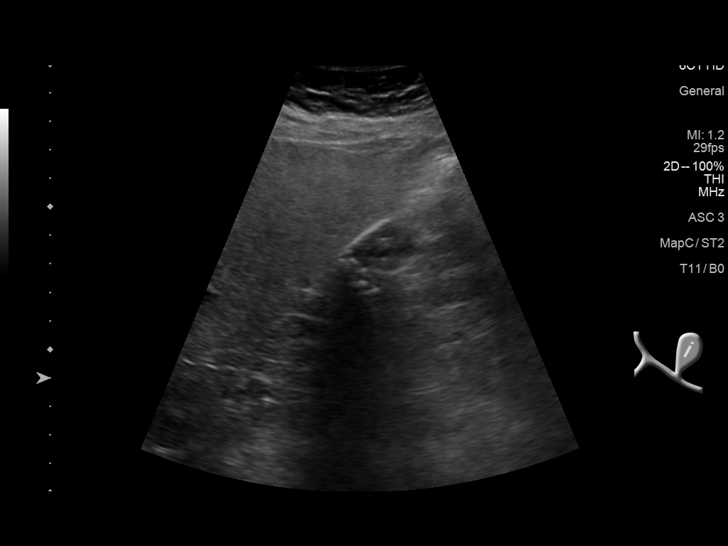
[im 9/101]
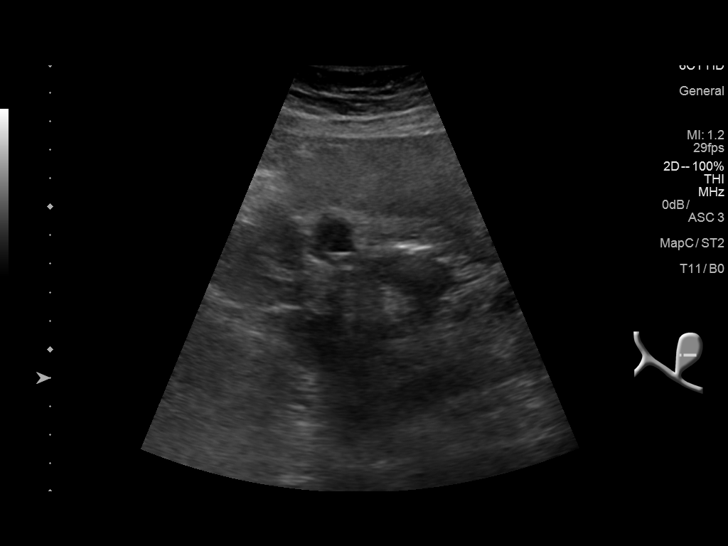
[im 17/101]
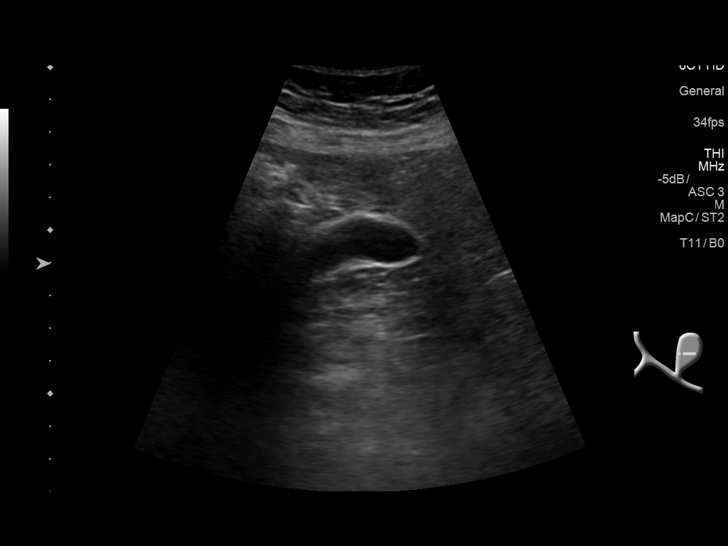
[im 26/101]
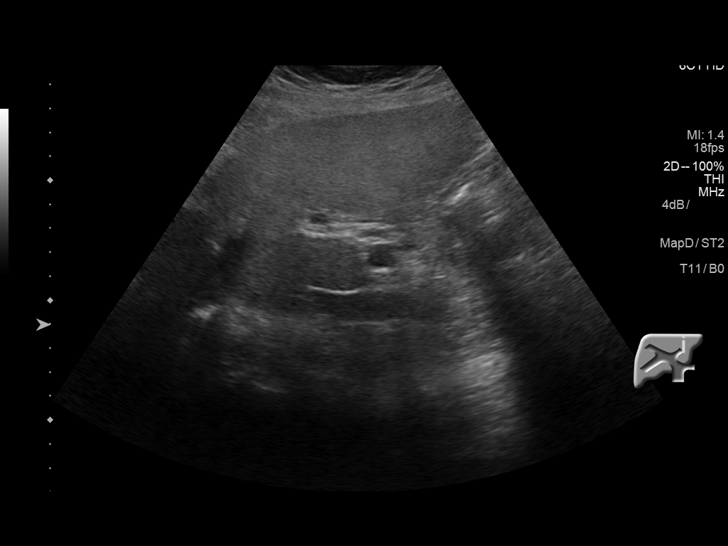
[im 34/101]
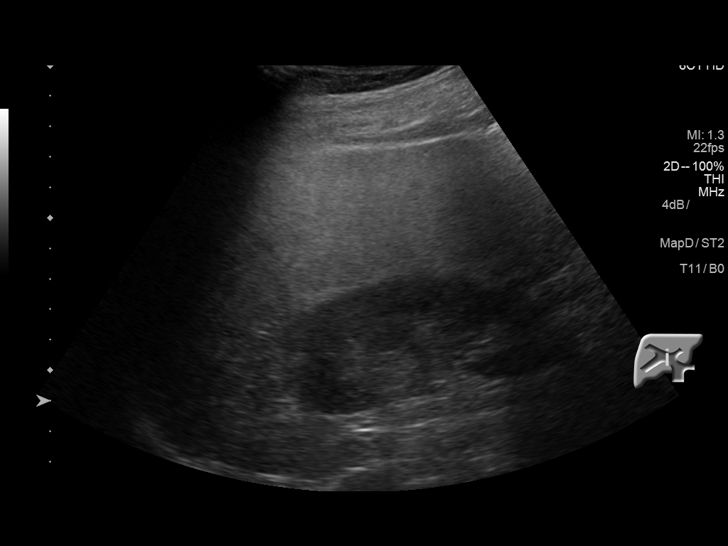
[im 42/101]
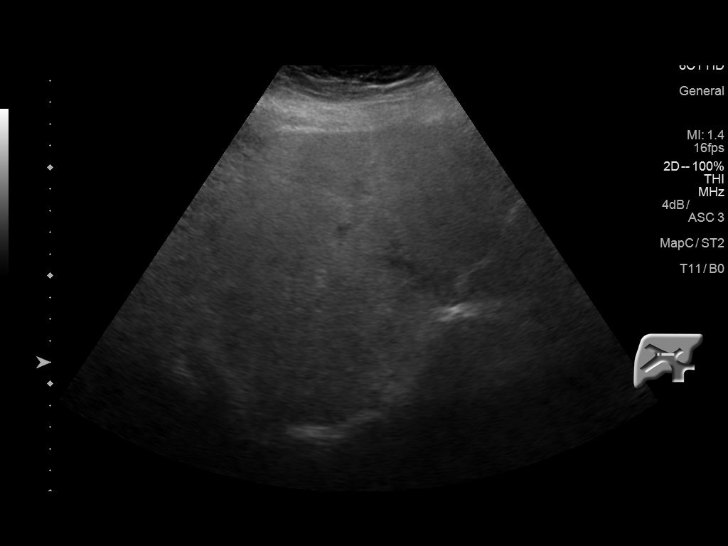
[im 51/101]
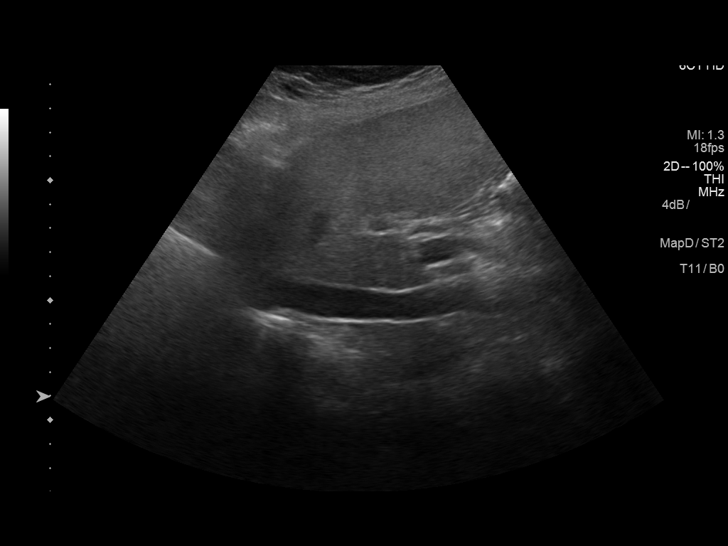
[im 59/101]
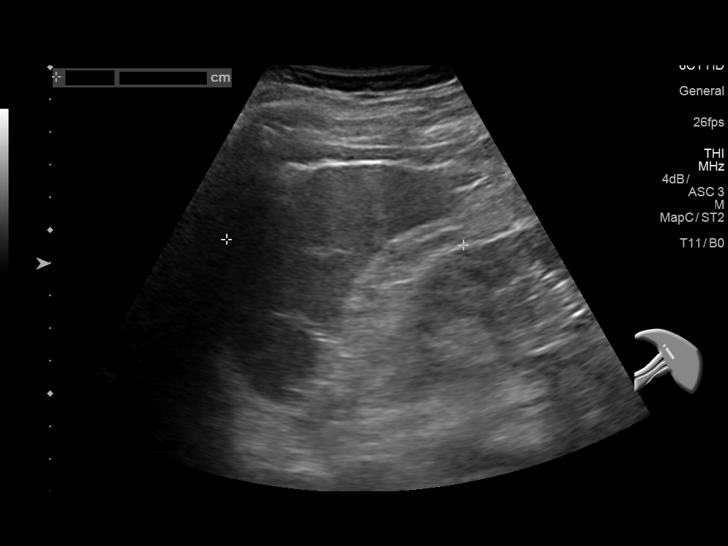
[im 67/101]
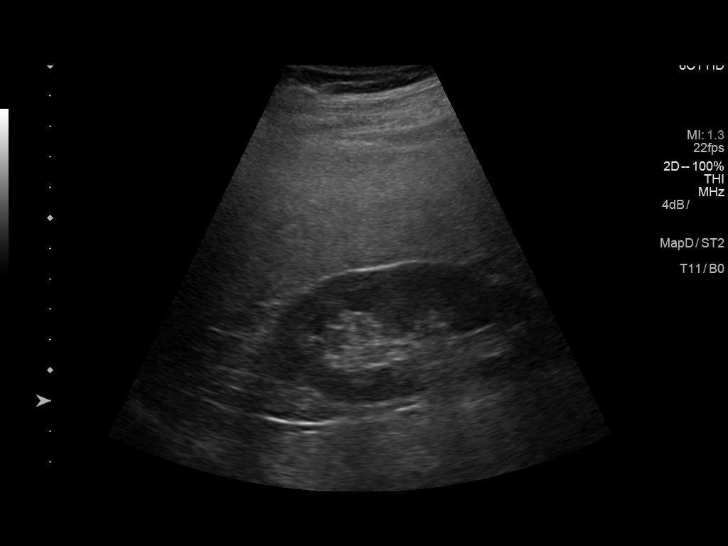
[im 76/101]
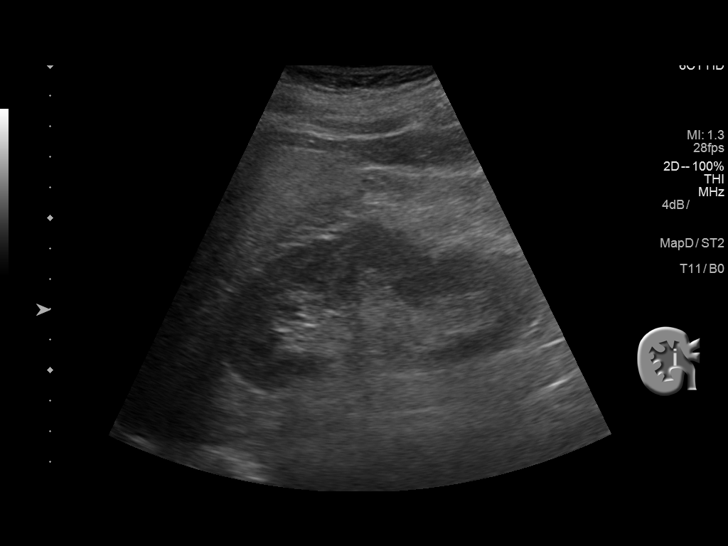
[im 84/101]
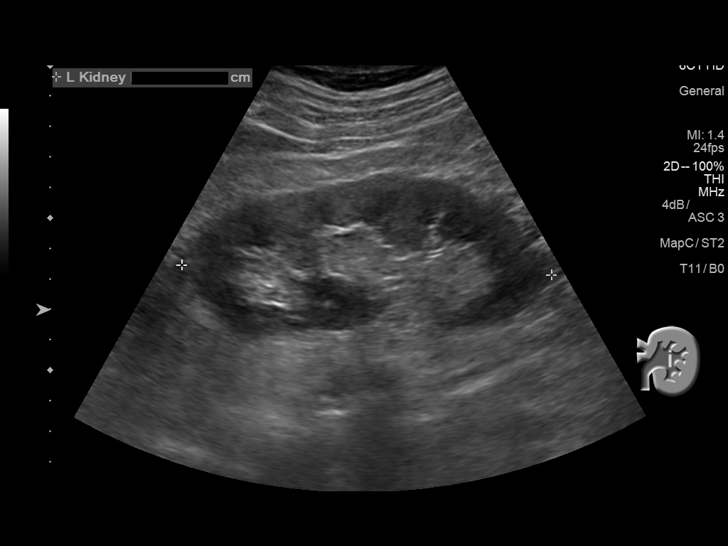
[im 92/101]
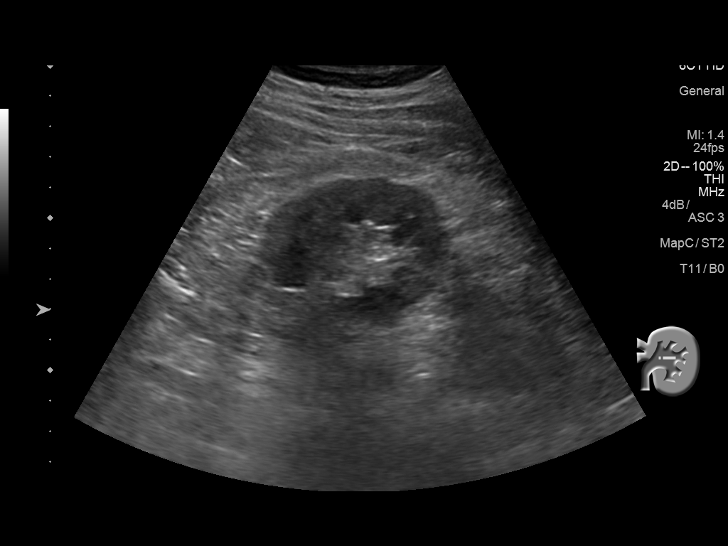
[im 101/101]
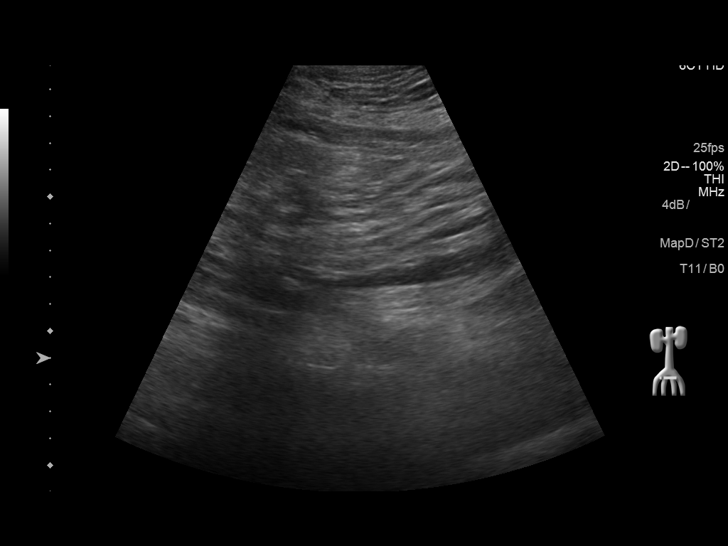

[13 of 25 positions shown; findings below may reference images not displayed]

FINDINGS: Gallbladder: The gallbladder is only partially distended. There are
multiple echogenic mobile shadowing stones with the largest
measuring just over 6 mm in diameter. There is no gallbladder wall
thickening, pericholecystic fluid, or positive sonographic Murphy's
sign.

Common bile duct: Diameter: 6 mm

Liver: The hepatic echotexture is increased diffusely. There is no
focal mass or ductal dilation. The surface contour of the liver
remains relatively smooth.

IVC: No abnormality visualized.

Pancreas: Bowel gas obscures the pancreatic head and tail. The
observed portions of the pancreatic body appear normal.

Spleen: The spleen is normal in size and echotexture.

Right Kidney: Length: 10.2 cm. Echogenicity within normal limits. No
mass or hydronephrosis visualized.

Left Kidney: Length: 12.1 cm. Echogenicity within normal limits. No
mass or hydronephrosis visualized.

Abdominal aorta: No aneurysm visualized.

Other findings: There is no ascites.
IMPRESSION: 1. Gallstones without sonographic evidence of acute cholecystitis.
2. Increased hepatic echotexture likely reflects fatty infiltrative
change. No definite cirrhotic changes are observed. There is limited
visualization of the pancreatic head and tail.
3. Normal size spleen
4. No acute abnormality of the kidneys or abdominal aorta.

## 2018-04-06 ENCOUNTER — Ambulatory Visit (INDEPENDENT_AMBULATORY_CARE_PROVIDER_SITE_OTHER): Payer: Medicare HMO | Admitting: Internal Medicine

## 2018-04-06 ENCOUNTER — Encounter: Payer: Self-pay | Admitting: Internal Medicine

## 2018-04-06 VITALS — BP 118/86 | HR 74 | Temp 98.3°F | Resp 16 | Ht 65.0 in | Wt 126.0 lb

## 2018-04-06 DIAGNOSIS — I2699 Other pulmonary embolism without acute cor pulmonale: Secondary | ICD-10-CM | POA: Diagnosis not present

## 2018-04-06 DIAGNOSIS — E871 Hypo-osmolality and hyponatremia: Secondary | ICD-10-CM

## 2018-04-06 DIAGNOSIS — I779 Disorder of arteries and arterioles, unspecified: Secondary | ICD-10-CM | POA: Diagnosis not present

## 2018-04-06 DIAGNOSIS — J439 Emphysema, unspecified: Secondary | ICD-10-CM

## 2018-04-06 DIAGNOSIS — L84 Corns and callosities: Secondary | ICD-10-CM

## 2018-04-06 DIAGNOSIS — J159 Unspecified bacterial pneumonia: Secondary | ICD-10-CM | POA: Diagnosis not present

## 2018-04-06 DIAGNOSIS — Z7901 Long term (current) use of anticoagulants: Secondary | ICD-10-CM

## 2018-04-06 NOTE — Progress Notes (Signed)
Date:  04/06/2018   Name:  Sandra Daniel   DOB:  02/02/1951   MRN:  [id number]   Chief Complaint: Hospitalization Follow-up Hospital follow up.  Admitted to [facility] 03/18/18 to 03/30/18.  TOC call on 04/01/18.  She reports feeling much better.  She has completely quit drinking.  She is gaining weight.  She is planning to transfer PCP to [facility] due to trouble coming this far.  03/23/18: Exam is positive for small pulmonary emboli to the left lower lobe as detailed above. No CT evidence of right heart strain. She was started on warfarin.  Discharge Destination: Home  Admission Diagnoses:  Hypoxia [R09.02] Cellulitis of right foot [O27.741]  Discharge Diagnoses:  Principal Problem: Community acquired bacterial pneumonia Active Problems: HTN (hypertension) Tobacco use Alcohol use disorder, severe, dependence (CMS-HCC) Alcoholism (CMS-HCC) COPD with exacerbation (CMS-HCC) Generalized anxiety disorder Resolved Problems: * No resolved hospital problems. *  START taking these medications  Details  aspirin 81 MG chewable tablet Take 1 tablet (81 mg total) by mouth once daily Qty: 30 tablet, Refills: 0   OLANZapine (ZYPREXA) 5 MG tablet Take 1 tablet (5 mg total) by mouth nightly Qty: 14 tablet, Refills: 0   thiamine (VITAMIN B-1) 100 MG tablet Take 1 tablet (100 mg total) by mouth 3 (three) times daily Qty: 90 tablet, Refills: 0   traZODone (DESYREL) 50 MG tablet Take 1 tablet (50 mg total) by mouth nightly as needed for Sleep Qty: 30 tablet, Refills: 1   warfarin (COUMADIN) 4 MG tablet Take 1 tablet (4 mg total) by mouth nightly Qty: 30 tablet, Refills: 0   STOP taking these medications   ALPRAZolam (XANAX) 0.5 MG tablet   azithromycin (ZITHROMAX) 250 MG tablet   clindamycin (CLEOCIN) 300 MG capsule   miscellaneous medical supply Misc   predniSONE (DELTASONE) 10 MG tablet   predniSONE (DELTASONE) 20 MG tablet      Results Pending at Discharge:  None Please see  phone numbers at end of this summary for lab contact information.    Anticipatory Guidance (items to address, including key med changes):  1. Please f/u with Eulah Pont, MD in 7 days for INR check and further titration of warfarin. 2. Monitor wound on lateral right foot.  Recommended Follow-up Studies:  1. Ill-defined infiltrate in the right upper lobe. Follow-up CT recommended in 6-8 weeks to ensure resolution. 2. Consider checking BMP to update serum sodium upon follow up visit. 3. Check INR to further adjust warfarin dose as needed.     Review of Systems  Constitutional: Negative for chills, fatigue and fever.  Respiratory: Positive for cough and shortness of breath. Negative for chest tightness and wheezing.   Cardiovascular: Negative for chest pain, palpitations and leg swelling.  Gastrointestinal: Negative for abdominal pain and blood in stool.  Genitourinary: Negative for hematuria.  Musculoskeletal: Negative for arthralgias.  Skin: Positive for wound (sore on lateral right foot healing).  Neurological: Negative for dizziness and headaches.  Hematological: Negative for adenopathy.  Psychiatric/Behavioral: Negative for dysphoric mood and sleep disturbance. The patient is not nervous/anxious.     Patient Active Problem List   Diagnosis Date Noted  . Community acquired bacterial pneumonia 03/18/2018  . Prediabetes 11/05/2017  . Bilateral sensorineural hearing loss 10/12/2017  . Macrocytosis without anemia 04/08/2016  . Erythrocytosis 04/08/2016  . Pre-ulcerative corn or callous 01/30/2016  . Peripheral arterial occlusive disease ([place]) 08/07/2015  . H/O amaurosis fugax 08/07/2015  . AA (alcohol abuse) 07/08/2015  .  Generalized anxiety disorder 07/08/2015  . Essential (primary) hypertension 07/08/2015  . HLD (hyperlipidemia) 07/08/2015  . Depression, major, single episode, in partial remission ([place]) 07/08/2015  . H/O Malignant melanoma 07/08/2015  .  Idiopathic insomnia 07/08/2015  . Chronic obstructive pulmonary emphysema ([place]) 07/08/2015  . Avitaminosis D 07/08/2015  . Thrombocytopenia ([place]) 12/04/2013  . Tobacco use disorder, severe, in early remission 12/04/2013  . Alcohol use disorder, severe, dependence ([place]) 12/04/2013  . Depression 12/04/2013    Prior to Admission medications   Medication Sig Start Date End Date Taking? Authorizing Provider  albuterol (VENTOLIN HFA) 108 (90 Base) MCG/ACT inhaler Inhale 2 puffs into the lungs every 6 (six) hours as needed for wheezing or shortness of breath. 10/12/17   Glean Hess, MD  ALPRAZolam Duanne Moron) 0.5 MG tablet TAKE 1 TABLET BY MOUTH 2 TIMES DAILY 11/19/17   Glean Hess, MD  amLODipine-benazepril (LOTREL) 5-10 MG capsule Take 1 capsule by mouth daily. 10/12/17   Glean Hess, MD  budesonide-formoterol [facility]) 160-4.5 MCG/ACT inhaler Inhale 2 puffs into the lungs 2 (two) times daily. 04/23/17 04/23/18  Glean Hess, MD  metoprolol succinate (TOPROL-XL) 50 MG 24 hr tablet Take 1 tablet (50 mg total) by mouth daily. Take with or immediately following a meal. 10/12/17   Glean Hess, MD  mirtazapine (REMERON) 15 MG tablet Take 1 tablet (15 mg total) by mouth at bedtime. 10/12/17   Glean Hess, MD  PARoxetine (PAXIL) 30 MG tablet Take 1 tablet (30 mg total) by mouth daily. 10/12/17   Glean Hess, MD    Allergies  Allergen Reactions  . Atorvastatin     myalgia  . Pristiq [Desvenlafaxine] Other (See Comments)    Numbness and worsening depression  . Lorazepam Anxiety    Past Surgical History:  Procedure Laterality Date  . ABDOMINAL HYSTERECTOMY    . Lumbar back surgery    . TONSILLECTOMY      Social History   Tobacco Use  . Smoking status: Former Smoker    Last attempt to quit: 07/08/2015    Years since quitting: 2.7  . Smokeless tobacco: Never Used  Substance Use Topics  . Alcohol use: Yes    Alcohol/week: 0.0 oz    Comment: 12 beers per  day  . Drug use: No     Medication list has been reviewed and updated.  Current Meds  Medication Sig  . albuterol (VENTOLIN HFA) 108 (90 Base) MCG/ACT inhaler Inhale 2 puffs into the lungs every 6 (six) hours as needed for wheezing or shortness of breath.  Marland Kitchen amLODipine-benazepril (LOTREL) 5-10 MG capsule Take 1 capsule by mouth daily.  Marland Kitchen aspirin 81 MG tablet Take 81 mg by mouth daily.  . budesonide-formoterol (SYMBICORT) 160-4.5 MCG/ACT inhaler Inhale 2 puffs into the lungs 2 (two) times daily.  Marland Kitchen gabapentin (NEURONTIN) 100 MG capsule Take 100 mg by mouth 3 (three) times daily.  . metoprolol succinate (TOPROL-XL) 50 MG 24 hr tablet Take 1 tablet (50 mg total) by mouth daily. Take with or immediately following a meal.  . mirtazapine (REMERON) 30 MG tablet Take 30 mg by mouth at bedtime.  Marland Kitchen OLANZapine (ZYPREXA) 5 MG tablet Take 5 mg by mouth at bedtime.  Marland Kitchen PARoxetine (PAXIL) 30 MG tablet Take 1 tablet (30 mg total) by mouth daily.  Marland Kitchen thiamine 100 MG tablet Take 100 mg by mouth daily.  . traZODone (DESYREL) 50 MG tablet Take 50 mg by mouth at bedtime.  Marland Kitchen warfarin (COUMADIN)  4 MG tablet Take 4 mg by mouth daily.  . [DISCONTINUED] mirtazapine (REMERON) 15 MG tablet Take 1 tablet (15 mg total) by mouth at bedtime.    PHQ 2/9 Scores 10/12/2017 04/23/2017 10/06/2016 01/30/2016  PHQ - 2 Score 4 0 4 0  PHQ- 9 Score 13 - 7 -    Physical Exam  Constitutional: She is oriented to person, place, and time. She appears well-developed. No distress.  HENT:  Head: Normocephalic and atraumatic.  Cardiovascular: Normal rate, regular rhythm and normal heart sounds.  Pulses:      Dorsalis pedis pulses are 0 on the right side, and 0 on the left side.       Posterior tibial pulses are 0 on the right side, and 0 on the left side.  Pulmonary/Chest: Effort normal. No respiratory distress. She has decreased breath sounds. She has no wheezes. She has no rhonchi.  Abdominal: Soft.  Multiple bruises on abdomen  from SQ lovenox  Musculoskeletal: Normal range of motion.  Neurological: She is alert and oriented to person, place, and time.  Skin: Skin is warm and dry. No rash noted.     Multiple bruises on both arms  Psychiatric: She has a normal mood and affect. Her behavior is normal. Thought content normal.  Nursing note and vitals reviewed.   BP 118/86   Pulse 74   Temp 98.3 F (36.8 C) (Oral)   Resp 16   Ht 5\' 5"  (1.651 m)   Wt 126 lb (57.2 kg)   SpO2 90%   BMI 20.97 kg/m   Assessment and Plan: 1. Pre-ulcerative corn or callous Healing per patient See Podiatry tomorrow  2. Acute pulmonary embolism without acute cor pulmonale, unspecified pulmonary embolism type (HCC) Continue warfarin Check INR  3. Peripheral arterial occlusive disease (HCC) stable  4. Pulmonary emphysema, unspecified emphysema type (HCC) Continue Symbicort - Protime-INR  5. Community acquired bacterial pneumonia Resolving clinically Needs CT to confirm clearing in 6 weeks  6. Hyponatremia Check labs - Basic metabolic panel  7. On warfarin therapy INR today   No orders of the defined types were placed in this encounter.   Partially dictated using Editor, commissioning. Any errors are unintentional.  Halina Maidens, MD [facility]  04/06/2018

## 2018-04-07 DIAGNOSIS — M2010 Hallux valgus (acquired), unspecified foot: Secondary | ICD-10-CM | POA: Diagnosis not present

## 2018-04-07 DIAGNOSIS — L03115 Cellulitis of right lower limb: Secondary | ICD-10-CM | POA: Diagnosis not present

## 2018-04-07 DIAGNOSIS — L03031 Cellulitis of right toe: Secondary | ICD-10-CM | POA: Diagnosis not present

## 2018-04-07 LAB — BASIC METABOLIC PANEL
BUN/Creatinine Ratio: 11 — ABNORMAL LOW (ref 12–28)
BUN: 7 mg/dL — AB (ref 8–27)
CALCIUM: 9.5 mg/dL (ref 8.7–10.3)
CHLORIDE: 97 mmol/L (ref 96–106)
CO2: 24 mmol/L (ref 20–29)
CREATININE: 0.64 mg/dL (ref 0.57–1.00)
GFR calc Af Amer: 107 mL/min/{1.73_m2} (ref >59)
GFR calc non Af Amer: 93 mL/min/{1.73_m2} (ref >59)
Glucose: 89 mg/dL (ref 65–99)
Potassium: 4.5 mmol/L (ref 3.5–5.2)
Sodium: 140 mmol/L (ref 134–144)

## 2018-04-07 LAB — PROTIME-INR
INR: 6.1 — AB (ref 0.8–1.2)
Prothrombin Time: 57 s — ABNORMAL HIGH (ref 9.1–12.0)

## 2018-04-08 ENCOUNTER — Telehealth: Payer: Self-pay

## 2018-04-08 NOTE — Telephone Encounter (Signed)
We can cancel her future appt with me.

## 2018-04-08 NOTE — Telephone Encounter (Signed)
Patient called stating she will no longer be using Dr. Army Melia as her PCP and wanted to know if she needed to keep her appt with her. Please advise patient she is wanting to know.

## 2018-04-11 ENCOUNTER — Other Ambulatory Visit: Payer: Self-pay | Admitting: Internal Medicine

## 2018-04-11 DIAGNOSIS — D72829 Elevated white blood cell count, unspecified: Secondary | ICD-10-CM | POA: Diagnosis not present

## 2018-04-12 ENCOUNTER — Ambulatory Visit: Payer: Medicare HMO | Admitting: Internal Medicine

## 2018-04-20 DIAGNOSIS — M722 Plantar fascial fibromatosis: Secondary | ICD-10-CM | POA: Diagnosis not present

## 2018-04-20 DIAGNOSIS — L97401 Non-pressure chronic ulcer of unspecified heel and midfoot limited to breakdown of skin: Secondary | ICD-10-CM | POA: Diagnosis not present

## 2018-04-30 DIAGNOSIS — R0902 Hypoxemia: Secondary | ICD-10-CM | POA: Diagnosis not present

## 2018-05-04 DIAGNOSIS — R69 Illness, unspecified: Secondary | ICD-10-CM | POA: Diagnosis not present

## 2018-05-04 DIAGNOSIS — R4586 Emotional lability: Secondary | ICD-10-CM | POA: Diagnosis not present

## 2018-05-11 DIAGNOSIS — L97511 Non-pressure chronic ulcer of other part of right foot limited to breakdown of skin: Secondary | ICD-10-CM | POA: Diagnosis not present

## 2018-05-12 DIAGNOSIS — M2011 Hallux valgus (acquired), right foot: Secondary | ICD-10-CM | POA: Diagnosis not present

## 2018-05-12 DIAGNOSIS — G453 Amaurosis fugax: Secondary | ICD-10-CM | POA: Diagnosis not present

## 2018-05-12 DIAGNOSIS — S91301A Unspecified open wound, right foot, initial encounter: Secondary | ICD-10-CM | POA: Diagnosis not present

## 2018-05-12 DIAGNOSIS — M21612 Bunion of left foot: Secondary | ICD-10-CM | POA: Diagnosis not present

## 2018-05-12 DIAGNOSIS — M79672 Pain in left foot: Secondary | ICD-10-CM | POA: Diagnosis not present

## 2018-05-12 DIAGNOSIS — M2012 Hallux valgus (acquired), left foot: Secondary | ICD-10-CM | POA: Diagnosis not present

## 2018-05-12 DIAGNOSIS — H2513 Age-related nuclear cataract, bilateral: Secondary | ICD-10-CM | POA: Diagnosis not present

## 2018-05-12 DIAGNOSIS — L97521 Non-pressure chronic ulcer of other part of left foot limited to breakdown of skin: Secondary | ICD-10-CM | POA: Diagnosis not present

## 2018-05-12 DIAGNOSIS — M21611 Bunion of right foot: Secondary | ICD-10-CM | POA: Diagnosis not present

## 2018-05-12 DIAGNOSIS — M79671 Pain in right foot: Secondary | ICD-10-CM | POA: Diagnosis not present

## 2018-05-12 DIAGNOSIS — I739 Peripheral vascular disease, unspecified: Secondary | ICD-10-CM | POA: Diagnosis not present

## 2018-05-12 DIAGNOSIS — M21621 Bunionette of right foot: Secondary | ICD-10-CM | POA: Diagnosis not present

## 2018-05-12 DIAGNOSIS — M21622 Bunionette of left foot: Secondary | ICD-10-CM | POA: Diagnosis not present

## 2018-05-17 ENCOUNTER — Other Ambulatory Visit: Payer: Self-pay | Admitting: Internal Medicine

## 2018-05-17 DIAGNOSIS — F324 Major depressive disorder, single episode, in partial remission: Secondary | ICD-10-CM

## 2018-05-18 ENCOUNTER — Telehealth: Payer: Self-pay

## 2018-05-18 NOTE — Telephone Encounter (Signed)
Dr Alene Mires called from [facility] about patient. Requesting that we call the pharmacy and cancel her refills for Xanax.   Informed her as of last month the patient is no longer in our care per her choice. Informed Dr Army Melia of the call and she approved to call and cancel the Rx. Informed Dr Mayer Masker and she verbalized understanding.  Called [facility] and canceled patients Xanax Rx.

## 2018-05-30 ENCOUNTER — Other Ambulatory Visit: Payer: Self-pay | Admitting: Internal Medicine

## 2018-05-30 DIAGNOSIS — F324 Major depressive disorder, single episode, in partial remission: Secondary | ICD-10-CM

## 2018-05-31 DIAGNOSIS — R0902 Hypoxemia: Secondary | ICD-10-CM | POA: Diagnosis not present

## 2018-05-31 DIAGNOSIS — L97511 Non-pressure chronic ulcer of other part of right foot limited to breakdown of skin: Secondary | ICD-10-CM | POA: Diagnosis not present

## 2018-06-07 ENCOUNTER — Other Ambulatory Visit: Payer: Self-pay | Admitting: Internal Medicine

## 2018-06-14 DIAGNOSIS — E871 Hypo-osmolality and hyponatremia: Secondary | ICD-10-CM | POA: Diagnosis not present

## 2018-06-14 DIAGNOSIS — I739 Peripheral vascular disease, unspecified: Secondary | ICD-10-CM | POA: Diagnosis not present

## 2018-06-14 DIAGNOSIS — R69 Illness, unspecified: Secondary | ICD-10-CM | POA: Diagnosis not present

## 2018-06-14 DIAGNOSIS — R9389 Abnormal findings on diagnostic imaging of other specified body structures: Secondary | ICD-10-CM | POA: Diagnosis not present

## 2018-06-14 DIAGNOSIS — Z87891 Personal history of nicotine dependence: Secondary | ICD-10-CM | POA: Diagnosis not present

## 2018-06-14 DIAGNOSIS — F3341 Major depressive disorder, recurrent, in partial remission: Secondary | ICD-10-CM | POA: Diagnosis not present

## 2018-06-14 DIAGNOSIS — I2699 Other pulmonary embolism without acute cor pulmonale: Secondary | ICD-10-CM | POA: Diagnosis not present

## 2018-06-14 DIAGNOSIS — Z79899 Other long term (current) drug therapy: Secondary | ICD-10-CM | POA: Diagnosis not present

## 2018-06-14 DIAGNOSIS — D751 Secondary polycythemia: Secondary | ICD-10-CM | POA: Diagnosis not present

## 2018-07-01 DIAGNOSIS — R0902 Hypoxemia: Secondary | ICD-10-CM | POA: Diagnosis not present

## 2018-07-04 DIAGNOSIS — I739 Peripheral vascular disease, unspecified: Secondary | ICD-10-CM | POA: Diagnosis not present

## 2018-07-05 DIAGNOSIS — R0602 Shortness of breath: Secondary | ICD-10-CM | POA: Diagnosis not present

## 2018-07-05 DIAGNOSIS — Z79899 Other long term (current) drug therapy: Secondary | ICD-10-CM | POA: Diagnosis not present

## 2018-07-05 DIAGNOSIS — R069 Unspecified abnormalities of breathing: Secondary | ICD-10-CM | POA: Diagnosis not present

## 2018-07-05 DIAGNOSIS — R0902 Hypoxemia: Secondary | ICD-10-CM | POA: Diagnosis not present

## 2018-07-05 DIAGNOSIS — R69 Illness, unspecified: Secondary | ICD-10-CM | POA: Diagnosis not present

## 2018-07-05 DIAGNOSIS — R42 Dizziness and giddiness: Secondary | ICD-10-CM | POA: Diagnosis not present

## 2018-07-05 DIAGNOSIS — I951 Orthostatic hypotension: Secondary | ICD-10-CM | POA: Diagnosis not present

## 2018-07-05 DIAGNOSIS — I4510 Unspecified right bundle-branch block: Secondary | ICD-10-CM | POA: Diagnosis not present

## 2018-07-05 DIAGNOSIS — Z5181 Encounter for therapeutic drug level monitoring: Secondary | ICD-10-CM | POA: Diagnosis not present

## 2018-07-05 DIAGNOSIS — R079 Chest pain, unspecified: Secondary | ICD-10-CM | POA: Diagnosis not present

## 2018-07-05 DIAGNOSIS — Z87891 Personal history of nicotine dependence: Secondary | ICD-10-CM | POA: Diagnosis not present

## 2018-07-05 DIAGNOSIS — R0600 Dyspnea, unspecified: Secondary | ICD-10-CM | POA: Diagnosis not present

## 2018-07-05 DIAGNOSIS — I959 Hypotension, unspecified: Secondary | ICD-10-CM | POA: Diagnosis not present

## 2018-07-07 DIAGNOSIS — R0600 Dyspnea, unspecified: Secondary | ICD-10-CM | POA: Diagnosis not present

## 2018-07-07 DIAGNOSIS — R9389 Abnormal findings on diagnostic imaging of other specified body structures: Secondary | ICD-10-CM | POA: Diagnosis not present

## 2018-07-12 DIAGNOSIS — G453 Amaurosis fugax: Secondary | ICD-10-CM | POA: Diagnosis not present

## 2018-07-12 DIAGNOSIS — H2513 Age-related nuclear cataract, bilateral: Secondary | ICD-10-CM | POA: Diagnosis not present

## 2018-07-12 DIAGNOSIS — R7303 Prediabetes: Secondary | ICD-10-CM | POA: Diagnosis not present

## 2018-07-12 DIAGNOSIS — Z9889 Other specified postprocedural states: Secondary | ICD-10-CM | POA: Diagnosis not present

## 2018-07-25 DIAGNOSIS — I10 Essential (primary) hypertension: Secondary | ICD-10-CM | POA: Diagnosis not present

## 2018-07-25 DIAGNOSIS — I739 Peripheral vascular disease, unspecified: Secondary | ICD-10-CM | POA: Diagnosis not present

## 2018-07-25 DIAGNOSIS — R69 Illness, unspecified: Secondary | ICD-10-CM | POA: Diagnosis not present

## 2018-07-25 DIAGNOSIS — Z720 Tobacco use: Secondary | ICD-10-CM | POA: Diagnosis not present

## 2018-07-31 DIAGNOSIS — R0902 Hypoxemia: Secondary | ICD-10-CM | POA: Diagnosis not present

## 2018-08-24 ENCOUNTER — Telehealth: Payer: Self-pay

## 2018-08-24 DIAGNOSIS — I10 Essential (primary) hypertension: Secondary | ICD-10-CM | POA: Diagnosis not present

## 2018-08-24 DIAGNOSIS — R69 Illness, unspecified: Secondary | ICD-10-CM | POA: Diagnosis not present

## 2018-08-24 DIAGNOSIS — H2512 Age-related nuclear cataract, left eye: Secondary | ICD-10-CM | POA: Diagnosis not present

## 2018-08-24 DIAGNOSIS — Z885 Allergy status to narcotic agent status: Secondary | ICD-10-CM | POA: Diagnosis not present

## 2018-08-24 DIAGNOSIS — H52222 Regular astigmatism, left eye: Secondary | ICD-10-CM | POA: Diagnosis not present

## 2018-08-24 DIAGNOSIS — J449 Chronic obstructive pulmonary disease, unspecified: Secondary | ICD-10-CM | POA: Diagnosis not present

## 2018-08-24 DIAGNOSIS — J9610 Chronic respiratory failure, unspecified whether with hypoxia or hypercapnia: Secondary | ICD-10-CM | POA: Diagnosis not present

## 2018-08-24 DIAGNOSIS — H25812 Combined forms of age-related cataract, left eye: Secondary | ICD-10-CM | POA: Diagnosis not present

## 2018-08-24 NOTE — Telephone Encounter (Signed)
Faxed [facility] Form notifying this is no longer our patient.

## 2018-08-31 DIAGNOSIS — L97522 Non-pressure chronic ulcer of other part of left foot with fat layer exposed: Secondary | ICD-10-CM | POA: Diagnosis not present

## 2018-08-31 DIAGNOSIS — R0902 Hypoxemia: Secondary | ICD-10-CM | POA: Diagnosis not present

## 2018-09-28 ENCOUNTER — Other Ambulatory Visit: Payer: Self-pay

## 2018-09-30 DIAGNOSIS — R0902 Hypoxemia: Secondary | ICD-10-CM | POA: Diagnosis not present

## 2018-10-04 DIAGNOSIS — R0602 Shortness of breath: Secondary | ICD-10-CM | POA: Diagnosis not present

## 2018-10-04 DIAGNOSIS — D72829 Elevated white blood cell count, unspecified: Secondary | ICD-10-CM | POA: Diagnosis not present

## 2018-10-04 DIAGNOSIS — T8189XA Other complications of procedures, not elsewhere classified, initial encounter: Secondary | ICD-10-CM | POA: Diagnosis not present

## 2018-10-04 DIAGNOSIS — R600 Localized edema: Secondary | ICD-10-CM | POA: Diagnosis not present

## 2018-10-04 DIAGNOSIS — I471 Supraventricular tachycardia: Secondary | ICD-10-CM | POA: Diagnosis not present

## 2018-10-04 DIAGNOSIS — E11621 Type 2 diabetes mellitus with foot ulcer: Secondary | ICD-10-CM | POA: Diagnosis not present

## 2018-10-04 DIAGNOSIS — I2510 Atherosclerotic heart disease of native coronary artery without angina pectoris: Secondary | ICD-10-CM | POA: Diagnosis not present

## 2018-10-04 DIAGNOSIS — I6523 Occlusion and stenosis of bilateral carotid arteries: Secondary | ICD-10-CM | POA: Diagnosis not present

## 2018-10-04 DIAGNOSIS — M86172 Other acute osteomyelitis, left ankle and foot: Secondary | ICD-10-CM | POA: Diagnosis not present

## 2018-10-04 DIAGNOSIS — R609 Edema, unspecified: Secondary | ICD-10-CM | POA: Diagnosis not present

## 2018-10-04 DIAGNOSIS — Z86711 Personal history of pulmonary embolism: Secondary | ICD-10-CM | POA: Diagnosis not present

## 2018-10-04 DIAGNOSIS — X58XXXA Exposure to other specified factors, initial encounter: Secondary | ICD-10-CM | POA: Diagnosis not present

## 2018-10-04 DIAGNOSIS — I70262 Atherosclerosis of native arteries of extremities with gangrene, left leg: Secondary | ICD-10-CM | POA: Diagnosis not present

## 2018-10-04 DIAGNOSIS — D45 Polycythemia vera: Secondary | ICD-10-CM | POA: Diagnosis not present

## 2018-10-04 DIAGNOSIS — G453 Amaurosis fugax: Secondary | ICD-10-CM | POA: Diagnosis not present

## 2018-10-04 DIAGNOSIS — D751 Secondary polycythemia: Secondary | ICD-10-CM | POA: Diagnosis not present

## 2018-10-04 DIAGNOSIS — J449 Chronic obstructive pulmonary disease, unspecified: Secondary | ICD-10-CM | POA: Diagnosis not present

## 2018-10-04 DIAGNOSIS — Y838 Other surgical procedures as the cause of abnormal reaction of the patient, or of later complication, without mention of misadventure at the time of the procedure: Secondary | ICD-10-CM | POA: Diagnosis not present

## 2018-10-04 DIAGNOSIS — I6522 Occlusion and stenosis of left carotid artery: Secondary | ICD-10-CM | POA: Diagnosis not present

## 2018-10-04 DIAGNOSIS — Z720 Tobacco use: Secondary | ICD-10-CM | POA: Diagnosis not present

## 2018-10-04 DIAGNOSIS — R221 Localized swelling, mass and lump, neck: Secondary | ICD-10-CM | POA: Diagnosis not present

## 2018-10-04 DIAGNOSIS — R0902 Hypoxemia: Secondary | ICD-10-CM | POA: Diagnosis not present

## 2018-10-04 DIAGNOSIS — I724 Aneurysm of artery of lower extremity: Secondary | ICD-10-CM | POA: Diagnosis not present

## 2018-10-04 DIAGNOSIS — Z716 Tobacco abuse counseling: Secondary | ICD-10-CM | POA: Diagnosis not present

## 2018-10-04 DIAGNOSIS — S35512A Injury of left iliac artery, initial encounter: Secondary | ICD-10-CM | POA: Diagnosis not present

## 2018-10-04 DIAGNOSIS — E871 Hypo-osmolality and hyponatremia: Secondary | ICD-10-CM | POA: Diagnosis not present

## 2018-10-04 DIAGNOSIS — I745 Embolism and thrombosis of iliac artery: Secondary | ICD-10-CM | POA: Diagnosis not present

## 2018-10-04 DIAGNOSIS — D473 Essential (hemorrhagic) thrombocythemia: Secondary | ICD-10-CM | POA: Diagnosis not present

## 2018-10-04 DIAGNOSIS — L97526 Non-pressure chronic ulcer of other part of left foot with bone involvement without evidence of necrosis: Secondary | ICD-10-CM | POA: Diagnosis not present

## 2018-10-04 DIAGNOSIS — I7409 Other arterial embolism and thrombosis of abdominal aorta: Secondary | ICD-10-CM | POA: Diagnosis not present

## 2018-10-04 DIAGNOSIS — I771 Stricture of artery: Secondary | ICD-10-CM | POA: Diagnosis not present

## 2018-10-04 DIAGNOSIS — R062 Wheezing: Secondary | ICD-10-CM | POA: Diagnosis not present

## 2018-10-04 DIAGNOSIS — M79672 Pain in left foot: Secondary | ICD-10-CM | POA: Diagnosis not present

## 2018-10-04 DIAGNOSIS — L97419 Non-pressure chronic ulcer of right heel and midfoot with unspecified severity: Secondary | ICD-10-CM | POA: Diagnosis not present

## 2018-10-04 DIAGNOSIS — Z01818 Encounter for other preprocedural examination: Secondary | ICD-10-CM | POA: Diagnosis not present

## 2018-10-04 DIAGNOSIS — I97638 Postprocedural hematoma of a circulatory system organ or structure following other circulatory system procedure: Secondary | ICD-10-CM | POA: Diagnosis not present

## 2018-10-04 DIAGNOSIS — R197 Diarrhea, unspecified: Secondary | ICD-10-CM | POA: Diagnosis not present

## 2018-10-04 DIAGNOSIS — D62 Acute posthemorrhagic anemia: Secondary | ICD-10-CM | POA: Diagnosis not present

## 2018-10-04 DIAGNOSIS — S301XXA Contusion of abdominal wall, initial encounter: Secondary | ICD-10-CM | POA: Diagnosis not present

## 2018-10-04 DIAGNOSIS — I10 Essential (primary) hypertension: Secondary | ICD-10-CM | POA: Diagnosis not present

## 2018-10-04 DIAGNOSIS — E878 Other disorders of electrolyte and fluid balance, not elsewhere classified: Secondary | ICD-10-CM | POA: Diagnosis not present

## 2018-10-04 DIAGNOSIS — R69 Illness, unspecified: Secondary | ICD-10-CM | POA: Diagnosis not present

## 2018-10-04 DIAGNOSIS — I739 Peripheral vascular disease, unspecified: Secondary | ICD-10-CM | POA: Diagnosis not present

## 2018-10-04 DIAGNOSIS — L97524 Non-pressure chronic ulcer of other part of left foot with necrosis of bone: Secondary | ICD-10-CM | POA: Diagnosis not present

## 2018-10-05 DIAGNOSIS — M86172 Other acute osteomyelitis, left ankle and foot: Secondary | ICD-10-CM | POA: Diagnosis not present

## 2018-10-05 DIAGNOSIS — I739 Peripheral vascular disease, unspecified: Secondary | ICD-10-CM | POA: Diagnosis not present

## 2018-10-05 DIAGNOSIS — R0902 Hypoxemia: Secondary | ICD-10-CM | POA: Diagnosis not present

## 2018-10-05 DIAGNOSIS — I745 Embolism and thrombosis of iliac artery: Secondary | ICD-10-CM | POA: Diagnosis not present

## 2018-10-05 DIAGNOSIS — E871 Hypo-osmolality and hyponatremia: Secondary | ICD-10-CM | POA: Diagnosis not present

## 2018-10-05 DIAGNOSIS — R600 Localized edema: Secondary | ICD-10-CM | POA: Diagnosis not present

## 2018-10-05 DIAGNOSIS — R69 Illness, unspecified: Secondary | ICD-10-CM | POA: Diagnosis not present

## 2018-10-05 DIAGNOSIS — I6523 Occlusion and stenosis of bilateral carotid arteries: Secondary | ICD-10-CM | POA: Diagnosis not present

## 2018-10-05 DIAGNOSIS — L97524 Non-pressure chronic ulcer of other part of left foot with necrosis of bone: Secondary | ICD-10-CM | POA: Diagnosis not present

## 2018-10-06 DIAGNOSIS — I739 Peripheral vascular disease, unspecified: Secondary | ICD-10-CM | POA: Diagnosis not present

## 2018-10-06 DIAGNOSIS — M86172 Other acute osteomyelitis, left ankle and foot: Secondary | ICD-10-CM | POA: Diagnosis not present

## 2018-10-06 DIAGNOSIS — I6523 Occlusion and stenosis of bilateral carotid arteries: Secondary | ICD-10-CM | POA: Diagnosis not present

## 2018-10-06 DIAGNOSIS — L97524 Non-pressure chronic ulcer of other part of left foot with necrosis of bone: Secondary | ICD-10-CM | POA: Diagnosis not present

## 2018-10-06 DIAGNOSIS — I745 Embolism and thrombosis of iliac artery: Secondary | ICD-10-CM | POA: Diagnosis not present

## 2018-10-06 DIAGNOSIS — R69 Illness, unspecified: Secondary | ICD-10-CM | POA: Diagnosis not present

## 2018-10-06 DIAGNOSIS — E871 Hypo-osmolality and hyponatremia: Secondary | ICD-10-CM | POA: Diagnosis not present

## 2018-10-07 DIAGNOSIS — R69 Illness, unspecified: Secondary | ICD-10-CM | POA: Diagnosis not present

## 2018-10-07 DIAGNOSIS — I6523 Occlusion and stenosis of bilateral carotid arteries: Secondary | ICD-10-CM | POA: Diagnosis not present

## 2018-10-07 DIAGNOSIS — Z01818 Encounter for other preprocedural examination: Secondary | ICD-10-CM | POA: Diagnosis not present

## 2018-10-07 DIAGNOSIS — Z720 Tobacco use: Secondary | ICD-10-CM | POA: Diagnosis not present

## 2018-10-07 DIAGNOSIS — M86172 Other acute osteomyelitis, left ankle and foot: Secondary | ICD-10-CM | POA: Diagnosis not present

## 2018-10-07 DIAGNOSIS — I10 Essential (primary) hypertension: Secondary | ICD-10-CM | POA: Diagnosis not present

## 2018-10-07 DIAGNOSIS — I739 Peripheral vascular disease, unspecified: Secondary | ICD-10-CM | POA: Diagnosis not present

## 2018-10-07 DIAGNOSIS — R0902 Hypoxemia: Secondary | ICD-10-CM | POA: Diagnosis not present

## 2018-10-07 DIAGNOSIS — I2510 Atherosclerotic heart disease of native coronary artery without angina pectoris: Secondary | ICD-10-CM | POA: Diagnosis not present

## 2018-10-07 DIAGNOSIS — Z86711 Personal history of pulmonary embolism: Secondary | ICD-10-CM | POA: Diagnosis not present

## 2018-10-07 DIAGNOSIS — L97524 Non-pressure chronic ulcer of other part of left foot with necrosis of bone: Secondary | ICD-10-CM | POA: Diagnosis not present

## 2018-10-08 DIAGNOSIS — Z86711 Personal history of pulmonary embolism: Secondary | ICD-10-CM | POA: Diagnosis not present

## 2018-10-08 DIAGNOSIS — I6523 Occlusion and stenosis of bilateral carotid arteries: Secondary | ICD-10-CM | POA: Diagnosis not present

## 2018-10-08 DIAGNOSIS — M86172 Other acute osteomyelitis, left ankle and foot: Secondary | ICD-10-CM | POA: Diagnosis not present

## 2018-10-08 DIAGNOSIS — R0902 Hypoxemia: Secondary | ICD-10-CM | POA: Diagnosis not present

## 2018-10-08 DIAGNOSIS — L97524 Non-pressure chronic ulcer of other part of left foot with necrosis of bone: Secondary | ICD-10-CM | POA: Diagnosis not present

## 2018-10-08 DIAGNOSIS — I739 Peripheral vascular disease, unspecified: Secondary | ICD-10-CM | POA: Diagnosis not present

## 2018-10-08 DIAGNOSIS — I10 Essential (primary) hypertension: Secondary | ICD-10-CM | POA: Diagnosis not present

## 2018-10-08 DIAGNOSIS — R69 Illness, unspecified: Secondary | ICD-10-CM | POA: Diagnosis not present

## 2018-10-08 DIAGNOSIS — Z720 Tobacco use: Secondary | ICD-10-CM | POA: Diagnosis not present

## 2018-10-09 DIAGNOSIS — I10 Essential (primary) hypertension: Secondary | ICD-10-CM | POA: Diagnosis not present

## 2018-10-09 DIAGNOSIS — Z720 Tobacco use: Secondary | ICD-10-CM | POA: Diagnosis not present

## 2018-10-09 DIAGNOSIS — Z86711 Personal history of pulmonary embolism: Secondary | ICD-10-CM | POA: Diagnosis not present

## 2018-10-09 DIAGNOSIS — Z716 Tobacco abuse counseling: Secondary | ICD-10-CM | POA: Diagnosis not present

## 2018-10-09 DIAGNOSIS — I739 Peripheral vascular disease, unspecified: Secondary | ICD-10-CM | POA: Diagnosis not present

## 2018-10-09 DIAGNOSIS — M86172 Other acute osteomyelitis, left ankle and foot: Secondary | ICD-10-CM | POA: Diagnosis not present

## 2018-10-09 DIAGNOSIS — I6523 Occlusion and stenosis of bilateral carotid arteries: Secondary | ICD-10-CM | POA: Diagnosis not present

## 2018-10-09 DIAGNOSIS — R69 Illness, unspecified: Secondary | ICD-10-CM | POA: Diagnosis not present

## 2018-10-09 DIAGNOSIS — R0902 Hypoxemia: Secondary | ICD-10-CM | POA: Diagnosis not present

## 2018-10-09 DIAGNOSIS — L97524 Non-pressure chronic ulcer of other part of left foot with necrosis of bone: Secondary | ICD-10-CM | POA: Diagnosis not present

## 2018-10-10 DIAGNOSIS — I739 Peripheral vascular disease, unspecified: Secondary | ICD-10-CM | POA: Diagnosis not present

## 2018-10-10 DIAGNOSIS — I6523 Occlusion and stenosis of bilateral carotid arteries: Secondary | ICD-10-CM | POA: Diagnosis not present

## 2018-10-10 DIAGNOSIS — R69 Illness, unspecified: Secondary | ICD-10-CM | POA: Diagnosis not present

## 2018-10-10 DIAGNOSIS — I10 Essential (primary) hypertension: Secondary | ICD-10-CM | POA: Diagnosis not present

## 2018-10-10 DIAGNOSIS — D62 Acute posthemorrhagic anemia: Secondary | ICD-10-CM | POA: Diagnosis not present

## 2018-10-10 DIAGNOSIS — I771 Stricture of artery: Secondary | ICD-10-CM | POA: Diagnosis not present

## 2018-10-10 DIAGNOSIS — E871 Hypo-osmolality and hyponatremia: Secondary | ICD-10-CM | POA: Diagnosis not present

## 2018-10-10 DIAGNOSIS — L97524 Non-pressure chronic ulcer of other part of left foot with necrosis of bone: Secondary | ICD-10-CM | POA: Diagnosis not present

## 2018-10-10 DIAGNOSIS — S301XXA Contusion of abdominal wall, initial encounter: Secondary | ICD-10-CM | POA: Diagnosis not present

## 2018-10-10 DIAGNOSIS — I2510 Atherosclerotic heart disease of native coronary artery without angina pectoris: Secondary | ICD-10-CM | POA: Diagnosis not present

## 2018-10-10 DIAGNOSIS — D45 Polycythemia vera: Secondary | ICD-10-CM | POA: Diagnosis not present

## 2018-10-10 DIAGNOSIS — M86172 Other acute osteomyelitis, left ankle and foot: Secondary | ICD-10-CM | POA: Diagnosis not present

## 2018-10-11 DIAGNOSIS — R0902 Hypoxemia: Secondary | ICD-10-CM | POA: Diagnosis not present

## 2018-10-11 DIAGNOSIS — E871 Hypo-osmolality and hyponatremia: Secondary | ICD-10-CM | POA: Diagnosis not present

## 2018-10-11 DIAGNOSIS — R69 Illness, unspecified: Secondary | ICD-10-CM | POA: Diagnosis not present

## 2018-10-11 DIAGNOSIS — I6523 Occlusion and stenosis of bilateral carotid arteries: Secondary | ICD-10-CM | POA: Diagnosis not present

## 2018-10-11 DIAGNOSIS — S301XXA Contusion of abdominal wall, initial encounter: Secondary | ICD-10-CM | POA: Diagnosis not present

## 2018-10-11 DIAGNOSIS — I10 Essential (primary) hypertension: Secondary | ICD-10-CM | POA: Diagnosis not present

## 2018-10-11 DIAGNOSIS — I739 Peripheral vascular disease, unspecified: Secondary | ICD-10-CM | POA: Diagnosis not present

## 2018-10-11 DIAGNOSIS — L97524 Non-pressure chronic ulcer of other part of left foot with necrosis of bone: Secondary | ICD-10-CM | POA: Diagnosis not present

## 2018-10-11 DIAGNOSIS — M86172 Other acute osteomyelitis, left ankle and foot: Secondary | ICD-10-CM | POA: Diagnosis not present

## 2018-10-11 DIAGNOSIS — Z86711 Personal history of pulmonary embolism: Secondary | ICD-10-CM | POA: Diagnosis not present

## 2018-10-11 DIAGNOSIS — I2510 Atherosclerotic heart disease of native coronary artery without angina pectoris: Secondary | ICD-10-CM | POA: Diagnosis not present

## 2018-10-11 DIAGNOSIS — Z720 Tobacco use: Secondary | ICD-10-CM | POA: Diagnosis not present

## 2018-10-11 DIAGNOSIS — D45 Polycythemia vera: Secondary | ICD-10-CM | POA: Diagnosis not present

## 2018-10-11 DIAGNOSIS — D62 Acute posthemorrhagic anemia: Secondary | ICD-10-CM | POA: Diagnosis not present

## 2018-10-12 ENCOUNTER — Ambulatory Visit: Payer: Medicare HMO

## 2018-10-12 DIAGNOSIS — D62 Acute posthemorrhagic anemia: Secondary | ICD-10-CM | POA: Diagnosis not present

## 2018-10-12 DIAGNOSIS — I739 Peripheral vascular disease, unspecified: Secondary | ICD-10-CM | POA: Diagnosis not present

## 2018-10-12 DIAGNOSIS — R0902 Hypoxemia: Secondary | ICD-10-CM | POA: Diagnosis not present

## 2018-10-12 DIAGNOSIS — R69 Illness, unspecified: Secondary | ICD-10-CM | POA: Diagnosis not present

## 2018-10-12 DIAGNOSIS — S301XXA Contusion of abdominal wall, initial encounter: Secondary | ICD-10-CM | POA: Diagnosis not present

## 2018-10-12 DIAGNOSIS — X58XXXA Exposure to other specified factors, initial encounter: Secondary | ICD-10-CM | POA: Diagnosis not present

## 2018-10-12 DIAGNOSIS — I10 Essential (primary) hypertension: Secondary | ICD-10-CM | POA: Diagnosis not present

## 2018-10-12 DIAGNOSIS — Z720 Tobacco use: Secondary | ICD-10-CM | POA: Diagnosis not present

## 2018-10-12 DIAGNOSIS — Z86711 Personal history of pulmonary embolism: Secondary | ICD-10-CM | POA: Diagnosis not present

## 2018-10-12 DIAGNOSIS — I724 Aneurysm of artery of lower extremity: Secondary | ICD-10-CM | POA: Diagnosis not present

## 2018-10-12 DIAGNOSIS — S35512A Injury of left iliac artery, initial encounter: Secondary | ICD-10-CM | POA: Diagnosis not present

## 2018-10-12 DIAGNOSIS — M86172 Other acute osteomyelitis, left ankle and foot: Secondary | ICD-10-CM | POA: Diagnosis not present

## 2018-10-13 ENCOUNTER — Ambulatory Visit: Payer: Medicare HMO

## 2018-10-13 DIAGNOSIS — I739 Peripheral vascular disease, unspecified: Secondary | ICD-10-CM | POA: Diagnosis not present

## 2018-10-13 DIAGNOSIS — Z720 Tobacco use: Secondary | ICD-10-CM | POA: Diagnosis not present

## 2018-10-13 DIAGNOSIS — M86172 Other acute osteomyelitis, left ankle and foot: Secondary | ICD-10-CM | POA: Diagnosis not present

## 2018-10-13 DIAGNOSIS — R0902 Hypoxemia: Secondary | ICD-10-CM | POA: Diagnosis not present

## 2018-10-13 DIAGNOSIS — I10 Essential (primary) hypertension: Secondary | ICD-10-CM | POA: Diagnosis not present

## 2018-10-13 DIAGNOSIS — D62 Acute posthemorrhagic anemia: Secondary | ICD-10-CM | POA: Diagnosis not present

## 2018-10-13 DIAGNOSIS — Z86711 Personal history of pulmonary embolism: Secondary | ICD-10-CM | POA: Diagnosis not present

## 2018-10-13 DIAGNOSIS — R69 Illness, unspecified: Secondary | ICD-10-CM | POA: Diagnosis not present

## 2018-10-13 DIAGNOSIS — I724 Aneurysm of artery of lower extremity: Secondary | ICD-10-CM | POA: Diagnosis not present

## 2018-10-14 ENCOUNTER — Encounter: Payer: Medicare HMO | Admitting: Internal Medicine

## 2018-10-14 DIAGNOSIS — I6522 Occlusion and stenosis of left carotid artery: Secondary | ICD-10-CM | POA: Diagnosis not present

## 2018-10-14 DIAGNOSIS — D72829 Elevated white blood cell count, unspecified: Secondary | ICD-10-CM | POA: Diagnosis not present

## 2018-10-14 DIAGNOSIS — I10 Essential (primary) hypertension: Secondary | ICD-10-CM | POA: Diagnosis not present

## 2018-10-14 DIAGNOSIS — J449 Chronic obstructive pulmonary disease, unspecified: Secondary | ICD-10-CM | POA: Diagnosis not present

## 2018-10-14 DIAGNOSIS — R69 Illness, unspecified: Secondary | ICD-10-CM | POA: Diagnosis not present

## 2018-10-14 DIAGNOSIS — D751 Secondary polycythemia: Secondary | ICD-10-CM | POA: Diagnosis not present

## 2018-10-14 DIAGNOSIS — I6523 Occlusion and stenosis of bilateral carotid arteries: Secondary | ICD-10-CM | POA: Diagnosis not present

## 2018-10-14 DIAGNOSIS — M86172 Other acute osteomyelitis, left ankle and foot: Secondary | ICD-10-CM | POA: Diagnosis not present

## 2018-10-14 DIAGNOSIS — I724 Aneurysm of artery of lower extremity: Secondary | ICD-10-CM | POA: Diagnosis not present

## 2018-10-15 DIAGNOSIS — I2510 Atherosclerotic heart disease of native coronary artery without angina pectoris: Secondary | ICD-10-CM | POA: Diagnosis not present

## 2018-10-15 DIAGNOSIS — J449 Chronic obstructive pulmonary disease, unspecified: Secondary | ICD-10-CM | POA: Diagnosis not present

## 2018-10-15 DIAGNOSIS — M86172 Other acute osteomyelitis, left ankle and foot: Secondary | ICD-10-CM | POA: Diagnosis not present

## 2018-10-15 DIAGNOSIS — I739 Peripheral vascular disease, unspecified: Secondary | ICD-10-CM | POA: Diagnosis not present

## 2018-10-15 DIAGNOSIS — R197 Diarrhea, unspecified: Secondary | ICD-10-CM | POA: Diagnosis not present

## 2018-10-15 DIAGNOSIS — R69 Illness, unspecified: Secondary | ICD-10-CM | POA: Diagnosis not present

## 2018-10-15 DIAGNOSIS — I10 Essential (primary) hypertension: Secondary | ICD-10-CM | POA: Diagnosis not present

## 2018-10-16 DIAGNOSIS — R221 Localized swelling, mass and lump, neck: Secondary | ICD-10-CM | POA: Diagnosis not present

## 2018-10-16 DIAGNOSIS — R197 Diarrhea, unspecified: Secondary | ICD-10-CM | POA: Diagnosis not present

## 2018-10-16 DIAGNOSIS — S301XXA Contusion of abdominal wall, initial encounter: Secondary | ICD-10-CM | POA: Diagnosis not present

## 2018-10-16 DIAGNOSIS — D72829 Elevated white blood cell count, unspecified: Secondary | ICD-10-CM | POA: Diagnosis not present

## 2018-10-16 DIAGNOSIS — M86172 Other acute osteomyelitis, left ankle and foot: Secondary | ICD-10-CM | POA: Diagnosis not present

## 2018-10-17 DIAGNOSIS — D72829 Elevated white blood cell count, unspecified: Secondary | ICD-10-CM | POA: Diagnosis not present

## 2018-10-17 DIAGNOSIS — M86172 Other acute osteomyelitis, left ankle and foot: Secondary | ICD-10-CM | POA: Diagnosis not present

## 2018-10-17 DIAGNOSIS — T8189XA Other complications of procedures, not elsewhere classified, initial encounter: Secondary | ICD-10-CM | POA: Diagnosis not present

## 2018-10-17 DIAGNOSIS — R221 Localized swelling, mass and lump, neck: Secondary | ICD-10-CM | POA: Diagnosis not present

## 2018-10-17 DIAGNOSIS — R197 Diarrhea, unspecified: Secondary | ICD-10-CM | POA: Diagnosis not present

## 2018-10-17 DIAGNOSIS — R600 Localized edema: Secondary | ICD-10-CM | POA: Diagnosis not present

## 2018-10-17 DIAGNOSIS — S301XXA Contusion of abdominal wall, initial encounter: Secondary | ICD-10-CM | POA: Diagnosis not present

## 2018-10-21 DIAGNOSIS — L97511 Non-pressure chronic ulcer of other part of right foot limited to breakdown of skin: Secondary | ICD-10-CM | POA: Diagnosis not present

## 2018-10-21 DIAGNOSIS — I70245 Atherosclerosis of native arteries of left leg with ulceration of other part of foot: Secondary | ICD-10-CM | POA: Diagnosis not present

## 2018-10-21 DIAGNOSIS — M86172 Other acute osteomyelitis, left ankle and foot: Secondary | ICD-10-CM | POA: Diagnosis not present

## 2018-10-21 DIAGNOSIS — I70235 Atherosclerosis of native arteries of right leg with ulceration of other part of foot: Secondary | ICD-10-CM | POA: Diagnosis not present

## 2018-10-21 DIAGNOSIS — D751 Secondary polycythemia: Secondary | ICD-10-CM | POA: Diagnosis not present

## 2018-10-21 DIAGNOSIS — D649 Anemia, unspecified: Secondary | ICD-10-CM | POA: Diagnosis not present

## 2018-10-21 DIAGNOSIS — Z48812 Encounter for surgical aftercare following surgery on the circulatory system: Secondary | ICD-10-CM | POA: Diagnosis not present

## 2018-10-21 DIAGNOSIS — I10 Essential (primary) hypertension: Secondary | ICD-10-CM | POA: Diagnosis not present

## 2018-10-21 DIAGNOSIS — L97521 Non-pressure chronic ulcer of other part of left foot limited to breakdown of skin: Secondary | ICD-10-CM | POA: Diagnosis not present

## 2018-10-21 DIAGNOSIS — I2510 Atherosclerotic heart disease of native coronary artery without angina pectoris: Secondary | ICD-10-CM | POA: Diagnosis not present

## 2018-10-24 DIAGNOSIS — Z48812 Encounter for surgical aftercare following surgery on the circulatory system: Secondary | ICD-10-CM | POA: Diagnosis not present

## 2018-10-24 DIAGNOSIS — I70235 Atherosclerosis of native arteries of right leg with ulceration of other part of foot: Secondary | ICD-10-CM | POA: Diagnosis not present

## 2018-10-24 DIAGNOSIS — I70245 Atherosclerosis of native arteries of left leg with ulceration of other part of foot: Secondary | ICD-10-CM | POA: Diagnosis not present

## 2018-10-24 DIAGNOSIS — D751 Secondary polycythemia: Secondary | ICD-10-CM | POA: Diagnosis not present

## 2018-10-24 DIAGNOSIS — I2510 Atherosclerotic heart disease of native coronary artery without angina pectoris: Secondary | ICD-10-CM | POA: Diagnosis not present

## 2018-10-24 DIAGNOSIS — I10 Essential (primary) hypertension: Secondary | ICD-10-CM | POA: Diagnosis not present

## 2018-10-24 DIAGNOSIS — L97511 Non-pressure chronic ulcer of other part of right foot limited to breakdown of skin: Secondary | ICD-10-CM | POA: Diagnosis not present

## 2018-10-24 DIAGNOSIS — M86172 Other acute osteomyelitis, left ankle and foot: Secondary | ICD-10-CM | POA: Diagnosis not present

## 2018-10-24 DIAGNOSIS — D649 Anemia, unspecified: Secondary | ICD-10-CM | POA: Diagnosis not present

## 2018-10-24 DIAGNOSIS — L97521 Non-pressure chronic ulcer of other part of left foot limited to breakdown of skin: Secondary | ICD-10-CM | POA: Diagnosis not present

## 2018-10-26 DIAGNOSIS — I10 Essential (primary) hypertension: Secondary | ICD-10-CM | POA: Diagnosis not present

## 2018-10-26 DIAGNOSIS — D649 Anemia, unspecified: Secondary | ICD-10-CM | POA: Diagnosis not present

## 2018-10-26 DIAGNOSIS — I70235 Atherosclerosis of native arteries of right leg with ulceration of other part of foot: Secondary | ICD-10-CM | POA: Diagnosis not present

## 2018-10-26 DIAGNOSIS — L97521 Non-pressure chronic ulcer of other part of left foot limited to breakdown of skin: Secondary | ICD-10-CM | POA: Diagnosis not present

## 2018-10-26 DIAGNOSIS — D751 Secondary polycythemia: Secondary | ICD-10-CM | POA: Diagnosis not present

## 2018-10-26 DIAGNOSIS — I70245 Atherosclerosis of native arteries of left leg with ulceration of other part of foot: Secondary | ICD-10-CM | POA: Diagnosis not present

## 2018-10-26 DIAGNOSIS — Z48812 Encounter for surgical aftercare following surgery on the circulatory system: Secondary | ICD-10-CM | POA: Diagnosis not present

## 2018-10-26 DIAGNOSIS — I2510 Atherosclerotic heart disease of native coronary artery without angina pectoris: Secondary | ICD-10-CM | POA: Diagnosis not present

## 2018-10-26 DIAGNOSIS — L97511 Non-pressure chronic ulcer of other part of right foot limited to breakdown of skin: Secondary | ICD-10-CM | POA: Diagnosis not present

## 2018-10-26 DIAGNOSIS — M86172 Other acute osteomyelitis, left ankle and foot: Secondary | ICD-10-CM | POA: Diagnosis not present

## 2018-10-27 DIAGNOSIS — R0902 Hypoxemia: Secondary | ICD-10-CM | POA: Diagnosis not present

## 2018-10-27 DIAGNOSIS — M86172 Other acute osteomyelitis, left ankle and foot: Secondary | ICD-10-CM | POA: Diagnosis not present

## 2018-10-27 DIAGNOSIS — Z48812 Encounter for surgical aftercare following surgery on the circulatory system: Secondary | ICD-10-CM | POA: Diagnosis not present

## 2018-10-27 DIAGNOSIS — I70245 Atherosclerosis of native arteries of left leg with ulceration of other part of foot: Secondary | ICD-10-CM | POA: Diagnosis not present

## 2018-10-27 DIAGNOSIS — I739 Peripheral vascular disease, unspecified: Secondary | ICD-10-CM | POA: Diagnosis not present

## 2018-10-27 DIAGNOSIS — L97511 Non-pressure chronic ulcer of other part of right foot limited to breakdown of skin: Secondary | ICD-10-CM | POA: Diagnosis not present

## 2018-10-27 DIAGNOSIS — I10 Essential (primary) hypertension: Secondary | ICD-10-CM | POA: Diagnosis not present

## 2018-10-27 DIAGNOSIS — I70235 Atherosclerosis of native arteries of right leg with ulceration of other part of foot: Secondary | ICD-10-CM | POA: Diagnosis not present

## 2018-10-27 DIAGNOSIS — I2510 Atherosclerotic heart disease of native coronary artery without angina pectoris: Secondary | ICD-10-CM | POA: Diagnosis not present

## 2018-10-27 DIAGNOSIS — Z86711 Personal history of pulmonary embolism: Secondary | ICD-10-CM | POA: Diagnosis not present

## 2018-10-27 DIAGNOSIS — L97521 Non-pressure chronic ulcer of other part of left foot limited to breakdown of skin: Secondary | ICD-10-CM | POA: Diagnosis not present

## 2018-10-27 DIAGNOSIS — D649 Anemia, unspecified: Secondary | ICD-10-CM | POA: Diagnosis not present

## 2018-10-27 DIAGNOSIS — D751 Secondary polycythemia: Secondary | ICD-10-CM | POA: Diagnosis not present

## 2018-10-28 DIAGNOSIS — I2510 Atherosclerotic heart disease of native coronary artery without angina pectoris: Secondary | ICD-10-CM | POA: Diagnosis not present

## 2018-10-28 DIAGNOSIS — I70235 Atherosclerosis of native arteries of right leg with ulceration of other part of foot: Secondary | ICD-10-CM | POA: Diagnosis not present

## 2018-10-28 DIAGNOSIS — I70245 Atherosclerosis of native arteries of left leg with ulceration of other part of foot: Secondary | ICD-10-CM | POA: Diagnosis not present

## 2018-10-28 DIAGNOSIS — Z48812 Encounter for surgical aftercare following surgery on the circulatory system: Secondary | ICD-10-CM | POA: Diagnosis not present

## 2018-10-28 DIAGNOSIS — D751 Secondary polycythemia: Secondary | ICD-10-CM | POA: Diagnosis not present

## 2018-10-28 DIAGNOSIS — M86172 Other acute osteomyelitis, left ankle and foot: Secondary | ICD-10-CM | POA: Diagnosis not present

## 2018-10-28 DIAGNOSIS — L97511 Non-pressure chronic ulcer of other part of right foot limited to breakdown of skin: Secondary | ICD-10-CM | POA: Diagnosis not present

## 2018-10-28 DIAGNOSIS — I10 Essential (primary) hypertension: Secondary | ICD-10-CM | POA: Diagnosis not present

## 2018-10-28 DIAGNOSIS — L97521 Non-pressure chronic ulcer of other part of left foot limited to breakdown of skin: Secondary | ICD-10-CM | POA: Diagnosis not present

## 2018-10-28 DIAGNOSIS — D649 Anemia, unspecified: Secondary | ICD-10-CM | POA: Diagnosis not present

## 2018-10-31 DIAGNOSIS — D751 Secondary polycythemia: Secondary | ICD-10-CM | POA: Diagnosis not present

## 2018-10-31 DIAGNOSIS — M86172 Other acute osteomyelitis, left ankle and foot: Secondary | ICD-10-CM | POA: Diagnosis not present

## 2018-10-31 DIAGNOSIS — I70245 Atherosclerosis of native arteries of left leg with ulceration of other part of foot: Secondary | ICD-10-CM | POA: Diagnosis not present

## 2018-10-31 DIAGNOSIS — L97511 Non-pressure chronic ulcer of other part of right foot limited to breakdown of skin: Secondary | ICD-10-CM | POA: Diagnosis not present

## 2018-10-31 DIAGNOSIS — I70235 Atherosclerosis of native arteries of right leg with ulceration of other part of foot: Secondary | ICD-10-CM | POA: Diagnosis not present

## 2018-10-31 DIAGNOSIS — I10 Essential (primary) hypertension: Secondary | ICD-10-CM | POA: Diagnosis not present

## 2018-10-31 DIAGNOSIS — Z48812 Encounter for surgical aftercare following surgery on the circulatory system: Secondary | ICD-10-CM | POA: Diagnosis not present

## 2018-10-31 DIAGNOSIS — L97521 Non-pressure chronic ulcer of other part of left foot limited to breakdown of skin: Secondary | ICD-10-CM | POA: Diagnosis not present

## 2018-10-31 DIAGNOSIS — R0902 Hypoxemia: Secondary | ICD-10-CM | POA: Diagnosis not present

## 2018-10-31 DIAGNOSIS — I2510 Atherosclerotic heart disease of native coronary artery without angina pectoris: Secondary | ICD-10-CM | POA: Diagnosis not present

## 2018-10-31 DIAGNOSIS — D649 Anemia, unspecified: Secondary | ICD-10-CM | POA: Diagnosis not present

## 2018-11-01 DIAGNOSIS — T8189XA Other complications of procedures, not elsewhere classified, initial encounter: Secondary | ICD-10-CM | POA: Diagnosis not present

## 2018-11-02 DIAGNOSIS — R69 Illness, unspecified: Secondary | ICD-10-CM | POA: Diagnosis not present

## 2018-11-02 DIAGNOSIS — L97909 Non-pressure chronic ulcer of unspecified part of unspecified lower leg with unspecified severity: Secondary | ICD-10-CM | POA: Diagnosis not present

## 2018-11-02 DIAGNOSIS — I739 Peripheral vascular disease, unspecified: Secondary | ICD-10-CM | POA: Diagnosis not present

## 2018-11-02 DIAGNOSIS — Z79899 Other long term (current) drug therapy: Secondary | ICD-10-CM | POA: Diagnosis not present

## 2018-11-02 DIAGNOSIS — I714 Abdominal aortic aneurysm, without rupture: Secondary | ICD-10-CM | POA: Diagnosis not present

## 2018-11-02 DIAGNOSIS — I6522 Occlusion and stenosis of left carotid artery: Secondary | ICD-10-CM | POA: Diagnosis not present

## 2018-11-04 DIAGNOSIS — M86172 Other acute osteomyelitis, left ankle and foot: Secondary | ICD-10-CM | POA: Diagnosis not present

## 2018-11-04 DIAGNOSIS — D751 Secondary polycythemia: Secondary | ICD-10-CM | POA: Diagnosis not present

## 2018-11-04 DIAGNOSIS — L97521 Non-pressure chronic ulcer of other part of left foot limited to breakdown of skin: Secondary | ICD-10-CM | POA: Diagnosis not present

## 2018-11-04 DIAGNOSIS — Z48812 Encounter for surgical aftercare following surgery on the circulatory system: Secondary | ICD-10-CM | POA: Diagnosis not present

## 2018-11-04 DIAGNOSIS — D649 Anemia, unspecified: Secondary | ICD-10-CM | POA: Diagnosis not present

## 2018-11-04 DIAGNOSIS — I70235 Atherosclerosis of native arteries of right leg with ulceration of other part of foot: Secondary | ICD-10-CM | POA: Diagnosis not present

## 2018-11-04 DIAGNOSIS — I10 Essential (primary) hypertension: Secondary | ICD-10-CM | POA: Diagnosis not present

## 2018-11-04 DIAGNOSIS — L97511 Non-pressure chronic ulcer of other part of right foot limited to breakdown of skin: Secondary | ICD-10-CM | POA: Diagnosis not present

## 2018-11-04 DIAGNOSIS — I70245 Atherosclerosis of native arteries of left leg with ulceration of other part of foot: Secondary | ICD-10-CM | POA: Diagnosis not present

## 2018-11-04 DIAGNOSIS — I2510 Atherosclerotic heart disease of native coronary artery without angina pectoris: Secondary | ICD-10-CM | POA: Diagnosis not present

## 2018-11-06 DIAGNOSIS — Z48812 Encounter for surgical aftercare following surgery on the circulatory system: Secondary | ICD-10-CM | POA: Diagnosis not present

## 2018-11-06 DIAGNOSIS — I70235 Atherosclerosis of native arteries of right leg with ulceration of other part of foot: Secondary | ICD-10-CM | POA: Diagnosis not present

## 2018-11-06 DIAGNOSIS — M86172 Other acute osteomyelitis, left ankle and foot: Secondary | ICD-10-CM | POA: Diagnosis not present

## 2018-11-07 DIAGNOSIS — I70245 Atherosclerosis of native arteries of left leg with ulceration of other part of foot: Secondary | ICD-10-CM | POA: Diagnosis not present

## 2018-11-07 DIAGNOSIS — D751 Secondary polycythemia: Secondary | ICD-10-CM | POA: Diagnosis not present

## 2018-11-07 DIAGNOSIS — L97521 Non-pressure chronic ulcer of other part of left foot limited to breakdown of skin: Secondary | ICD-10-CM | POA: Diagnosis not present

## 2018-11-07 DIAGNOSIS — I2510 Atherosclerotic heart disease of native coronary artery without angina pectoris: Secondary | ICD-10-CM | POA: Diagnosis not present

## 2018-11-07 DIAGNOSIS — I70235 Atherosclerosis of native arteries of right leg with ulceration of other part of foot: Secondary | ICD-10-CM | POA: Diagnosis not present

## 2018-11-07 DIAGNOSIS — Z48812 Encounter for surgical aftercare following surgery on the circulatory system: Secondary | ICD-10-CM | POA: Diagnosis not present

## 2018-11-07 DIAGNOSIS — L97511 Non-pressure chronic ulcer of other part of right foot limited to breakdown of skin: Secondary | ICD-10-CM | POA: Diagnosis not present

## 2018-11-07 DIAGNOSIS — D649 Anemia, unspecified: Secondary | ICD-10-CM | POA: Diagnosis not present

## 2018-11-07 DIAGNOSIS — I10 Essential (primary) hypertension: Secondary | ICD-10-CM | POA: Diagnosis not present

## 2018-11-07 DIAGNOSIS — M86172 Other acute osteomyelitis, left ankle and foot: Secondary | ICD-10-CM | POA: Diagnosis not present

## 2018-11-08 DIAGNOSIS — L97521 Non-pressure chronic ulcer of other part of left foot limited to breakdown of skin: Secondary | ICD-10-CM | POA: Diagnosis not present

## 2018-11-08 DIAGNOSIS — I70245 Atherosclerosis of native arteries of left leg with ulceration of other part of foot: Secondary | ICD-10-CM | POA: Diagnosis not present

## 2018-11-08 DIAGNOSIS — L97511 Non-pressure chronic ulcer of other part of right foot limited to breakdown of skin: Secondary | ICD-10-CM | POA: Diagnosis not present

## 2018-11-08 DIAGNOSIS — I70235 Atherosclerosis of native arteries of right leg with ulceration of other part of foot: Secondary | ICD-10-CM | POA: Diagnosis not present

## 2018-11-08 DIAGNOSIS — D751 Secondary polycythemia: Secondary | ICD-10-CM | POA: Diagnosis not present

## 2018-11-08 DIAGNOSIS — M86172 Other acute osteomyelitis, left ankle and foot: Secondary | ICD-10-CM | POA: Diagnosis not present

## 2018-11-08 DIAGNOSIS — I2510 Atherosclerotic heart disease of native coronary artery without angina pectoris: Secondary | ICD-10-CM | POA: Diagnosis not present

## 2018-11-08 DIAGNOSIS — I10 Essential (primary) hypertension: Secondary | ICD-10-CM | POA: Diagnosis not present

## 2018-11-08 DIAGNOSIS — Z48812 Encounter for surgical aftercare following surgery on the circulatory system: Secondary | ICD-10-CM | POA: Diagnosis not present

## 2018-11-08 DIAGNOSIS — D649 Anemia, unspecified: Secondary | ICD-10-CM | POA: Diagnosis not present

## 2018-11-09 DIAGNOSIS — D751 Secondary polycythemia: Secondary | ICD-10-CM | POA: Diagnosis not present

## 2018-11-09 DIAGNOSIS — L97521 Non-pressure chronic ulcer of other part of left foot limited to breakdown of skin: Secondary | ICD-10-CM | POA: Diagnosis not present

## 2018-11-09 DIAGNOSIS — M86172 Other acute osteomyelitis, left ankle and foot: Secondary | ICD-10-CM | POA: Diagnosis not present

## 2018-11-09 DIAGNOSIS — L97511 Non-pressure chronic ulcer of other part of right foot limited to breakdown of skin: Secondary | ICD-10-CM | POA: Diagnosis not present

## 2018-11-09 DIAGNOSIS — I2510 Atherosclerotic heart disease of native coronary artery without angina pectoris: Secondary | ICD-10-CM | POA: Diagnosis not present

## 2018-11-09 DIAGNOSIS — I70245 Atherosclerosis of native arteries of left leg with ulceration of other part of foot: Secondary | ICD-10-CM | POA: Diagnosis not present

## 2018-11-09 DIAGNOSIS — I70235 Atherosclerosis of native arteries of right leg with ulceration of other part of foot: Secondary | ICD-10-CM | POA: Diagnosis not present

## 2018-11-09 DIAGNOSIS — I10 Essential (primary) hypertension: Secondary | ICD-10-CM | POA: Diagnosis not present

## 2018-11-09 DIAGNOSIS — Z48812 Encounter for surgical aftercare following surgery on the circulatory system: Secondary | ICD-10-CM | POA: Diagnosis not present

## 2018-11-09 DIAGNOSIS — D649 Anemia, unspecified: Secondary | ICD-10-CM | POA: Diagnosis not present

## 2018-11-11 DIAGNOSIS — D751 Secondary polycythemia: Secondary | ICD-10-CM | POA: Diagnosis not present

## 2018-11-11 DIAGNOSIS — M86172 Other acute osteomyelitis, left ankle and foot: Secondary | ICD-10-CM | POA: Diagnosis not present

## 2018-11-11 DIAGNOSIS — I70235 Atherosclerosis of native arteries of right leg with ulceration of other part of foot: Secondary | ICD-10-CM | POA: Diagnosis not present

## 2018-11-11 DIAGNOSIS — L97511 Non-pressure chronic ulcer of other part of right foot limited to breakdown of skin: Secondary | ICD-10-CM | POA: Diagnosis not present

## 2018-11-11 DIAGNOSIS — I70245 Atherosclerosis of native arteries of left leg with ulceration of other part of foot: Secondary | ICD-10-CM | POA: Diagnosis not present

## 2018-11-11 DIAGNOSIS — Z48812 Encounter for surgical aftercare following surgery on the circulatory system: Secondary | ICD-10-CM | POA: Diagnosis not present

## 2018-11-11 DIAGNOSIS — I2510 Atherosclerotic heart disease of native coronary artery without angina pectoris: Secondary | ICD-10-CM | POA: Diagnosis not present

## 2018-11-11 DIAGNOSIS — I10 Essential (primary) hypertension: Secondary | ICD-10-CM | POA: Diagnosis not present

## 2018-11-11 DIAGNOSIS — D649 Anemia, unspecified: Secondary | ICD-10-CM | POA: Diagnosis not present

## 2018-11-11 DIAGNOSIS — L97521 Non-pressure chronic ulcer of other part of left foot limited to breakdown of skin: Secondary | ICD-10-CM | POA: Diagnosis not present

## 2018-11-14 DIAGNOSIS — I70235 Atherosclerosis of native arteries of right leg with ulceration of other part of foot: Secondary | ICD-10-CM | POA: Diagnosis not present

## 2018-11-14 DIAGNOSIS — L97521 Non-pressure chronic ulcer of other part of left foot limited to breakdown of skin: Secondary | ICD-10-CM | POA: Diagnosis not present

## 2018-11-14 DIAGNOSIS — I70245 Atherosclerosis of native arteries of left leg with ulceration of other part of foot: Secondary | ICD-10-CM | POA: Diagnosis not present

## 2018-11-14 DIAGNOSIS — I2510 Atherosclerotic heart disease of native coronary artery without angina pectoris: Secondary | ICD-10-CM | POA: Diagnosis not present

## 2018-11-14 DIAGNOSIS — D751 Secondary polycythemia: Secondary | ICD-10-CM | POA: Diagnosis not present

## 2018-11-14 DIAGNOSIS — M86172 Other acute osteomyelitis, left ankle and foot: Secondary | ICD-10-CM | POA: Diagnosis not present

## 2018-11-14 DIAGNOSIS — D649 Anemia, unspecified: Secondary | ICD-10-CM | POA: Diagnosis not present

## 2018-11-14 DIAGNOSIS — I10 Essential (primary) hypertension: Secondary | ICD-10-CM | POA: Diagnosis not present

## 2018-11-14 DIAGNOSIS — Z48812 Encounter for surgical aftercare following surgery on the circulatory system: Secondary | ICD-10-CM | POA: Diagnosis not present

## 2018-11-14 DIAGNOSIS — L97511 Non-pressure chronic ulcer of other part of right foot limited to breakdown of skin: Secondary | ICD-10-CM | POA: Diagnosis not present

## 2018-11-16 DIAGNOSIS — L97511 Non-pressure chronic ulcer of other part of right foot limited to breakdown of skin: Secondary | ICD-10-CM | POA: Diagnosis not present

## 2018-11-16 DIAGNOSIS — I70245 Atherosclerosis of native arteries of left leg with ulceration of other part of foot: Secondary | ICD-10-CM | POA: Diagnosis not present

## 2018-11-16 DIAGNOSIS — Z48812 Encounter for surgical aftercare following surgery on the circulatory system: Secondary | ICD-10-CM | POA: Diagnosis not present

## 2018-11-16 DIAGNOSIS — M86172 Other acute osteomyelitis, left ankle and foot: Secondary | ICD-10-CM | POA: Diagnosis not present

## 2018-11-16 DIAGNOSIS — I10 Essential (primary) hypertension: Secondary | ICD-10-CM | POA: Diagnosis not present

## 2018-11-16 DIAGNOSIS — D751 Secondary polycythemia: Secondary | ICD-10-CM | POA: Diagnosis not present

## 2018-11-16 DIAGNOSIS — L97521 Non-pressure chronic ulcer of other part of left foot limited to breakdown of skin: Secondary | ICD-10-CM | POA: Diagnosis not present

## 2018-11-16 DIAGNOSIS — D649 Anemia, unspecified: Secondary | ICD-10-CM | POA: Diagnosis not present

## 2018-11-16 DIAGNOSIS — I70235 Atherosclerosis of native arteries of right leg with ulceration of other part of foot: Secondary | ICD-10-CM | POA: Diagnosis not present

## 2018-11-16 DIAGNOSIS — I2510 Atherosclerotic heart disease of native coronary artery without angina pectoris: Secondary | ICD-10-CM | POA: Diagnosis not present

## 2018-11-17 DIAGNOSIS — T8189XA Other complications of procedures, not elsewhere classified, initial encounter: Secondary | ICD-10-CM | POA: Diagnosis not present

## 2018-11-18 DIAGNOSIS — I10 Essential (primary) hypertension: Secondary | ICD-10-CM | POA: Diagnosis not present

## 2018-11-18 DIAGNOSIS — D649 Anemia, unspecified: Secondary | ICD-10-CM | POA: Diagnosis not present

## 2018-11-18 DIAGNOSIS — Z48812 Encounter for surgical aftercare following surgery on the circulatory system: Secondary | ICD-10-CM | POA: Diagnosis not present

## 2018-11-18 DIAGNOSIS — I70235 Atherosclerosis of native arteries of right leg with ulceration of other part of foot: Secondary | ICD-10-CM | POA: Diagnosis not present

## 2018-11-18 DIAGNOSIS — D751 Secondary polycythemia: Secondary | ICD-10-CM | POA: Diagnosis not present

## 2018-11-18 DIAGNOSIS — I70245 Atherosclerosis of native arteries of left leg with ulceration of other part of foot: Secondary | ICD-10-CM | POA: Diagnosis not present

## 2018-11-18 DIAGNOSIS — L97521 Non-pressure chronic ulcer of other part of left foot limited to breakdown of skin: Secondary | ICD-10-CM | POA: Diagnosis not present

## 2018-11-18 DIAGNOSIS — M86172 Other acute osteomyelitis, left ankle and foot: Secondary | ICD-10-CM | POA: Diagnosis not present

## 2018-11-18 DIAGNOSIS — I2510 Atherosclerotic heart disease of native coronary artery without angina pectoris: Secondary | ICD-10-CM | POA: Diagnosis not present

## 2018-11-18 DIAGNOSIS — L97511 Non-pressure chronic ulcer of other part of right foot limited to breakdown of skin: Secondary | ICD-10-CM | POA: Diagnosis not present

## 2018-11-21 DIAGNOSIS — I70235 Atherosclerosis of native arteries of right leg with ulceration of other part of foot: Secondary | ICD-10-CM | POA: Diagnosis not present

## 2018-11-21 DIAGNOSIS — I10 Essential (primary) hypertension: Secondary | ICD-10-CM | POA: Diagnosis not present

## 2018-11-21 DIAGNOSIS — D751 Secondary polycythemia: Secondary | ICD-10-CM | POA: Diagnosis not present

## 2018-11-21 DIAGNOSIS — Z48812 Encounter for surgical aftercare following surgery on the circulatory system: Secondary | ICD-10-CM | POA: Diagnosis not present

## 2018-11-21 DIAGNOSIS — M86172 Other acute osteomyelitis, left ankle and foot: Secondary | ICD-10-CM | POA: Diagnosis not present

## 2018-11-21 DIAGNOSIS — I70245 Atherosclerosis of native arteries of left leg with ulceration of other part of foot: Secondary | ICD-10-CM | POA: Diagnosis not present

## 2018-11-21 DIAGNOSIS — L97521 Non-pressure chronic ulcer of other part of left foot limited to breakdown of skin: Secondary | ICD-10-CM | POA: Diagnosis not present

## 2018-11-21 DIAGNOSIS — L97511 Non-pressure chronic ulcer of other part of right foot limited to breakdown of skin: Secondary | ICD-10-CM | POA: Diagnosis not present

## 2018-11-21 DIAGNOSIS — I2510 Atherosclerotic heart disease of native coronary artery without angina pectoris: Secondary | ICD-10-CM | POA: Diagnosis not present

## 2018-11-21 DIAGNOSIS — D649 Anemia, unspecified: Secondary | ICD-10-CM | POA: Diagnosis not present

## 2018-11-25 DIAGNOSIS — I6522 Occlusion and stenosis of left carotid artery: Secondary | ICD-10-CM | POA: Diagnosis not present

## 2018-11-29 DIAGNOSIS — M86172 Other acute osteomyelitis, left ankle and foot: Secondary | ICD-10-CM | POA: Diagnosis not present

## 2018-11-29 DIAGNOSIS — L97511 Non-pressure chronic ulcer of other part of right foot limited to breakdown of skin: Secondary | ICD-10-CM | POA: Diagnosis not present

## 2018-11-29 DIAGNOSIS — I70235 Atherosclerosis of native arteries of right leg with ulceration of other part of foot: Secondary | ICD-10-CM | POA: Diagnosis not present

## 2018-11-29 DIAGNOSIS — L97521 Non-pressure chronic ulcer of other part of left foot limited to breakdown of skin: Secondary | ICD-10-CM | POA: Diagnosis not present

## 2018-11-29 DIAGNOSIS — D751 Secondary polycythemia: Secondary | ICD-10-CM | POA: Diagnosis not present

## 2018-11-29 DIAGNOSIS — D649 Anemia, unspecified: Secondary | ICD-10-CM | POA: Diagnosis not present

## 2018-11-29 DIAGNOSIS — I10 Essential (primary) hypertension: Secondary | ICD-10-CM | POA: Diagnosis not present

## 2018-11-29 DIAGNOSIS — I2510 Atherosclerotic heart disease of native coronary artery without angina pectoris: Secondary | ICD-10-CM | POA: Diagnosis not present

## 2018-11-29 DIAGNOSIS — Z48812 Encounter for surgical aftercare following surgery on the circulatory system: Secondary | ICD-10-CM | POA: Diagnosis not present

## 2018-11-29 DIAGNOSIS — I70245 Atherosclerosis of native arteries of left leg with ulceration of other part of foot: Secondary | ICD-10-CM | POA: Diagnosis not present

## 2018-12-01 DIAGNOSIS — I10 Essential (primary) hypertension: Secondary | ICD-10-CM | POA: Diagnosis not present

## 2018-12-01 DIAGNOSIS — M86172 Other acute osteomyelitis, left ankle and foot: Secondary | ICD-10-CM | POA: Diagnosis not present

## 2018-12-01 DIAGNOSIS — D751 Secondary polycythemia: Secondary | ICD-10-CM | POA: Diagnosis not present

## 2018-12-01 DIAGNOSIS — R0902 Hypoxemia: Secondary | ICD-10-CM | POA: Diagnosis not present

## 2018-12-01 DIAGNOSIS — L97511 Non-pressure chronic ulcer of other part of right foot limited to breakdown of skin: Secondary | ICD-10-CM | POA: Diagnosis not present

## 2018-12-01 DIAGNOSIS — I2510 Atherosclerotic heart disease of native coronary artery without angina pectoris: Secondary | ICD-10-CM | POA: Diagnosis not present

## 2018-12-01 DIAGNOSIS — Z48812 Encounter for surgical aftercare following surgery on the circulatory system: Secondary | ICD-10-CM | POA: Diagnosis not present

## 2018-12-01 DIAGNOSIS — I70245 Atherosclerosis of native arteries of left leg with ulceration of other part of foot: Secondary | ICD-10-CM | POA: Diagnosis not present

## 2018-12-01 DIAGNOSIS — L97521 Non-pressure chronic ulcer of other part of left foot limited to breakdown of skin: Secondary | ICD-10-CM | POA: Diagnosis not present

## 2018-12-01 DIAGNOSIS — I70235 Atherosclerosis of native arteries of right leg with ulceration of other part of foot: Secondary | ICD-10-CM | POA: Diagnosis not present

## 2018-12-01 DIAGNOSIS — D649 Anemia, unspecified: Secondary | ICD-10-CM | POA: Diagnosis not present

## 2018-12-06 DIAGNOSIS — I10 Essential (primary) hypertension: Secondary | ICD-10-CM | POA: Diagnosis not present

## 2018-12-06 DIAGNOSIS — L97521 Non-pressure chronic ulcer of other part of left foot limited to breakdown of skin: Secondary | ICD-10-CM | POA: Diagnosis not present

## 2018-12-06 DIAGNOSIS — Z48812 Encounter for surgical aftercare following surgery on the circulatory system: Secondary | ICD-10-CM | POA: Diagnosis not present

## 2018-12-06 DIAGNOSIS — D649 Anemia, unspecified: Secondary | ICD-10-CM | POA: Diagnosis not present

## 2018-12-06 DIAGNOSIS — D751 Secondary polycythemia: Secondary | ICD-10-CM | POA: Diagnosis not present

## 2018-12-06 DIAGNOSIS — M86172 Other acute osteomyelitis, left ankle and foot: Secondary | ICD-10-CM | POA: Diagnosis not present

## 2018-12-06 DIAGNOSIS — L97511 Non-pressure chronic ulcer of other part of right foot limited to breakdown of skin: Secondary | ICD-10-CM | POA: Diagnosis not present

## 2018-12-06 DIAGNOSIS — I2510 Atherosclerotic heart disease of native coronary artery without angina pectoris: Secondary | ICD-10-CM | POA: Diagnosis not present

## 2018-12-06 DIAGNOSIS — I70245 Atherosclerosis of native arteries of left leg with ulceration of other part of foot: Secondary | ICD-10-CM | POA: Diagnosis not present

## 2018-12-06 DIAGNOSIS — I70235 Atherosclerosis of native arteries of right leg with ulceration of other part of foot: Secondary | ICD-10-CM | POA: Diagnosis not present

## 2018-12-08 DIAGNOSIS — I70235 Atherosclerosis of native arteries of right leg with ulceration of other part of foot: Secondary | ICD-10-CM | POA: Diagnosis not present

## 2018-12-08 DIAGNOSIS — L97521 Non-pressure chronic ulcer of other part of left foot limited to breakdown of skin: Secondary | ICD-10-CM | POA: Diagnosis not present

## 2018-12-08 DIAGNOSIS — M86172 Other acute osteomyelitis, left ankle and foot: Secondary | ICD-10-CM | POA: Diagnosis not present

## 2018-12-08 DIAGNOSIS — L97511 Non-pressure chronic ulcer of other part of right foot limited to breakdown of skin: Secondary | ICD-10-CM | POA: Diagnosis not present

## 2018-12-08 DIAGNOSIS — I10 Essential (primary) hypertension: Secondary | ICD-10-CM | POA: Diagnosis not present

## 2018-12-08 DIAGNOSIS — I2510 Atherosclerotic heart disease of native coronary artery without angina pectoris: Secondary | ICD-10-CM | POA: Diagnosis not present

## 2018-12-08 DIAGNOSIS — D751 Secondary polycythemia: Secondary | ICD-10-CM | POA: Diagnosis not present

## 2018-12-08 DIAGNOSIS — Z48812 Encounter for surgical aftercare following surgery on the circulatory system: Secondary | ICD-10-CM | POA: Diagnosis not present

## 2018-12-08 DIAGNOSIS — I70245 Atherosclerosis of native arteries of left leg with ulceration of other part of foot: Secondary | ICD-10-CM | POA: Diagnosis not present

## 2018-12-08 DIAGNOSIS — D649 Anemia, unspecified: Secondary | ICD-10-CM | POA: Diagnosis not present

## 2018-12-14 DIAGNOSIS — R404 Transient alteration of awareness: Secondary | ICD-10-CM | POA: Diagnosis not present

## 2018-12-14 DIAGNOSIS — R69 Illness, unspecified: Secondary | ICD-10-CM | POA: Diagnosis not present

## 2018-12-14 DIAGNOSIS — I10 Essential (primary) hypertension: Secondary | ICD-10-CM | POA: Diagnosis not present

## 2018-12-14 DIAGNOSIS — R569 Unspecified convulsions: Secondary | ICD-10-CM | POA: Diagnosis not present

## 2018-12-14 DIAGNOSIS — R4020 Unspecified coma: Secondary | ICD-10-CM | POA: Diagnosis not present

## 2018-12-14 DIAGNOSIS — R4182 Altered mental status, unspecified: Secondary | ICD-10-CM | POA: Diagnosis not present

## 2018-12-14 DIAGNOSIS — R410 Disorientation, unspecified: Secondary | ICD-10-CM | POA: Diagnosis not present

## 2018-12-14 DIAGNOSIS — R109 Unspecified abdominal pain: Secondary | ICD-10-CM | POA: Diagnosis not present

## 2018-12-14 DIAGNOSIS — R000 Tachycardia, unspecified: Secondary | ICD-10-CM | POA: Diagnosis not present

## 2018-12-14 DIAGNOSIS — Z86711 Personal history of pulmonary embolism: Secondary | ICD-10-CM | POA: Diagnosis not present

## 2018-12-14 DIAGNOSIS — R29818 Other symptoms and signs involving the nervous system: Secondary | ICD-10-CM | POA: Diagnosis not present

## 2018-12-14 DIAGNOSIS — R0989 Other specified symptoms and signs involving the circulatory and respiratory systems: Secondary | ICD-10-CM | POA: Diagnosis not present

## 2018-12-14 DIAGNOSIS — E871 Hypo-osmolality and hyponatremia: Secondary | ICD-10-CM | POA: Diagnosis not present

## 2018-12-15 DIAGNOSIS — M86171 Other acute osteomyelitis, right ankle and foot: Secondary | ICD-10-CM | POA: Diagnosis not present

## 2018-12-15 DIAGNOSIS — Y711 Therapeutic (nonsurgical) and rehabilitative cardiovascular devices associated with adverse incidents: Secondary | ICD-10-CM | POA: Diagnosis not present

## 2018-12-15 DIAGNOSIS — R569 Unspecified convulsions: Secondary | ICD-10-CM | POA: Diagnosis not present

## 2018-12-15 DIAGNOSIS — Z89422 Acquired absence of other left toe(s): Secondary | ICD-10-CM | POA: Diagnosis not present

## 2018-12-15 DIAGNOSIS — I96 Gangrene, not elsewhere classified: Secondary | ICD-10-CM | POA: Diagnosis not present

## 2018-12-15 DIAGNOSIS — R69 Illness, unspecified: Secondary | ICD-10-CM | POA: Diagnosis not present

## 2018-12-15 DIAGNOSIS — R29818 Other symptoms and signs involving the nervous system: Secondary | ICD-10-CM | POA: Diagnosis not present

## 2018-12-15 DIAGNOSIS — R5383 Other fatigue: Secondary | ICD-10-CM | POA: Diagnosis not present

## 2018-12-15 DIAGNOSIS — R0989 Other specified symptoms and signs involving the circulatory and respiratory systems: Secondary | ICD-10-CM | POA: Diagnosis not present

## 2018-12-15 DIAGNOSIS — I739 Peripheral vascular disease, unspecified: Secondary | ICD-10-CM | POA: Diagnosis not present

## 2018-12-15 DIAGNOSIS — G8918 Other acute postprocedural pain: Secondary | ICD-10-CM | POA: Diagnosis not present

## 2018-12-15 DIAGNOSIS — I6523 Occlusion and stenosis of bilateral carotid arteries: Secondary | ICD-10-CM | POA: Diagnosis not present

## 2018-12-15 DIAGNOSIS — S79922A Unspecified injury of left thigh, initial encounter: Secondary | ICD-10-CM | POA: Diagnosis not present

## 2018-12-15 DIAGNOSIS — Z86711 Personal history of pulmonary embolism: Secondary | ICD-10-CM | POA: Diagnosis not present

## 2018-12-15 DIAGNOSIS — D649 Anemia, unspecified: Secondary | ICD-10-CM | POA: Diagnosis not present

## 2018-12-15 DIAGNOSIS — I2510 Atherosclerotic heart disease of native coronary artery without angina pectoris: Secondary | ICD-10-CM | POA: Diagnosis not present

## 2018-12-15 DIAGNOSIS — I7389 Other specified peripheral vascular diseases: Secondary | ICD-10-CM | POA: Diagnosis not present

## 2018-12-15 DIAGNOSIS — M47812 Spondylosis without myelopathy or radiculopathy, cervical region: Secondary | ICD-10-CM | POA: Diagnosis not present

## 2018-12-15 DIAGNOSIS — N390 Urinary tract infection, site not specified: Secondary | ICD-10-CM | POA: Diagnosis not present

## 2018-12-15 DIAGNOSIS — R41841 Cognitive communication deficit: Secondary | ICD-10-CM | POA: Diagnosis not present

## 2018-12-15 DIAGNOSIS — R4182 Altered mental status, unspecified: Secondary | ICD-10-CM | POA: Diagnosis not present

## 2018-12-15 DIAGNOSIS — J441 Chronic obstructive pulmonary disease with (acute) exacerbation: Secondary | ICD-10-CM | POA: Diagnosis not present

## 2018-12-15 DIAGNOSIS — M8580 Other specified disorders of bone density and structure, unspecified site: Secondary | ICD-10-CM | POA: Diagnosis not present

## 2018-12-15 DIAGNOSIS — J9811 Atelectasis: Secondary | ICD-10-CM | POA: Diagnosis not present

## 2018-12-15 DIAGNOSIS — L97509 Non-pressure chronic ulcer of other part of unspecified foot with unspecified severity: Secondary | ICD-10-CM | POA: Diagnosis not present

## 2018-12-15 DIAGNOSIS — M6281 Muscle weakness (generalized): Secondary | ICD-10-CM | POA: Diagnosis not present

## 2018-12-15 DIAGNOSIS — G40501 Epileptic seizures related to external causes, not intractable, with status epilepticus: Secondary | ICD-10-CM | POA: Diagnosis not present

## 2018-12-15 DIAGNOSIS — R109 Unspecified abdominal pain: Secondary | ICD-10-CM | POA: Diagnosis not present

## 2018-12-15 DIAGNOSIS — L97529 Non-pressure chronic ulcer of other part of left foot with unspecified severity: Secondary | ICD-10-CM | POA: Diagnosis not present

## 2018-12-15 DIAGNOSIS — Z681 Body mass index (BMI) 19 or less, adult: Secondary | ICD-10-CM | POA: Diagnosis not present

## 2018-12-15 DIAGNOSIS — E2740 Unspecified adrenocortical insufficiency: Secondary | ICD-10-CM | POA: Diagnosis not present

## 2018-12-15 DIAGNOSIS — M86672 Other chronic osteomyelitis, left ankle and foot: Secondary | ICD-10-CM | POA: Diagnosis not present

## 2018-12-15 DIAGNOSIS — M8660 Other chronic osteomyelitis, unspecified site: Secondary | ICD-10-CM | POA: Diagnosis not present

## 2018-12-15 DIAGNOSIS — S91302A Unspecified open wound, left foot, initial encounter: Secondary | ICD-10-CM | POA: Diagnosis not present

## 2018-12-15 DIAGNOSIS — E871 Hypo-osmolality and hyponatremia: Secondary | ICD-10-CM | POA: Diagnosis not present

## 2018-12-15 DIAGNOSIS — J90 Pleural effusion, not elsewhere classified: Secondary | ICD-10-CM | POA: Diagnosis not present

## 2018-12-15 DIAGNOSIS — I9581 Postprocedural hypotension: Secondary | ICD-10-CM | POA: Diagnosis not present

## 2018-12-15 DIAGNOSIS — S79912A Unspecified injury of left hip, initial encounter: Secondary | ICD-10-CM | POA: Diagnosis not present

## 2018-12-15 DIAGNOSIS — L97519 Non-pressure chronic ulcer of other part of right foot with unspecified severity: Secondary | ICD-10-CM | POA: Diagnosis not present

## 2018-12-15 DIAGNOSIS — M86172 Other acute osteomyelitis, left ankle and foot: Secondary | ICD-10-CM | POA: Diagnosis not present

## 2018-12-15 DIAGNOSIS — Z9181 History of falling: Secondary | ICD-10-CM | POA: Diagnosis not present

## 2018-12-15 DIAGNOSIS — J95821 Acute postprocedural respiratory failure: Secondary | ICD-10-CM | POA: Diagnosis not present

## 2018-12-15 DIAGNOSIS — T82856A Stenosis of peripheral vascular stent, initial encounter: Secondary | ICD-10-CM | POA: Diagnosis not present

## 2018-12-15 DIAGNOSIS — G9340 Encephalopathy, unspecified: Secondary | ICD-10-CM | POA: Diagnosis not present

## 2018-12-15 DIAGNOSIS — R0902 Hypoxemia: Secondary | ICD-10-CM | POA: Diagnosis not present

## 2018-12-15 DIAGNOSIS — R0689 Other abnormalities of breathing: Secondary | ICD-10-CM | POA: Diagnosis not present

## 2018-12-15 DIAGNOSIS — M869 Osteomyelitis, unspecified: Secondary | ICD-10-CM | POA: Diagnosis not present

## 2018-12-15 DIAGNOSIS — R1084 Generalized abdominal pain: Secondary | ICD-10-CM | POA: Diagnosis not present

## 2018-12-15 DIAGNOSIS — M4802 Spinal stenosis, cervical region: Secondary | ICD-10-CM | POA: Diagnosis not present

## 2018-12-15 DIAGNOSIS — Z720 Tobacco use: Secondary | ICD-10-CM | POA: Diagnosis not present

## 2018-12-15 DIAGNOSIS — I7409 Other arterial embolism and thrombosis of abdominal aorta: Secondary | ICD-10-CM | POA: Diagnosis not present

## 2018-12-15 DIAGNOSIS — E43 Unspecified severe protein-calorie malnutrition: Secondary | ICD-10-CM | POA: Diagnosis not present

## 2018-12-15 DIAGNOSIS — I959 Hypotension, unspecified: Secondary | ICD-10-CM | POA: Diagnosis not present

## 2018-12-15 DIAGNOSIS — M86671 Other chronic osteomyelitis, right ankle and foot: Secondary | ICD-10-CM | POA: Diagnosis not present

## 2018-12-15 DIAGNOSIS — R269 Unspecified abnormalities of gait and mobility: Secondary | ICD-10-CM | POA: Diagnosis not present

## 2018-12-15 DIAGNOSIS — G4089 Other seizures: Secondary | ICD-10-CM | POA: Diagnosis not present

## 2018-12-15 DIAGNOSIS — E222 Syndrome of inappropriate secretion of antidiuretic hormone: Secondary | ICD-10-CM | POA: Diagnosis not present

## 2018-12-15 DIAGNOSIS — I6522 Occlusion and stenosis of left carotid artery: Secondary | ICD-10-CM | POA: Diagnosis not present

## 2018-12-15 DIAGNOSIS — J449 Chronic obstructive pulmonary disease, unspecified: Secondary | ICD-10-CM | POA: Diagnosis not present

## 2018-12-15 DIAGNOSIS — D72829 Elevated white blood cell count, unspecified: Secondary | ICD-10-CM | POA: Diagnosis not present

## 2018-12-15 DIAGNOSIS — Z89411 Acquired absence of right great toe: Secondary | ICD-10-CM | POA: Diagnosis not present

## 2018-12-15 DIAGNOSIS — I10 Essential (primary) hypertension: Secondary | ICD-10-CM | POA: Diagnosis not present

## 2018-12-15 DIAGNOSIS — R918 Other nonspecific abnormal finding of lung field: Secondary | ICD-10-CM | POA: Diagnosis not present

## 2018-12-15 DIAGNOSIS — G40901 Epilepsy, unspecified, not intractable, with status epilepticus: Secondary | ICD-10-CM | POA: Diagnosis not present

## 2018-12-15 DIAGNOSIS — G4700 Insomnia, unspecified: Secondary | ICD-10-CM | POA: Diagnosis not present

## 2018-12-15 DIAGNOSIS — W19XXXA Unspecified fall, initial encounter: Secondary | ICD-10-CM | POA: Diagnosis not present

## 2018-12-15 DIAGNOSIS — R0600 Dyspnea, unspecified: Secondary | ICD-10-CM | POA: Diagnosis not present

## 2018-12-16 DIAGNOSIS — I10 Essential (primary) hypertension: Secondary | ICD-10-CM | POA: Diagnosis not present

## 2018-12-16 DIAGNOSIS — I6522 Occlusion and stenosis of left carotid artery: Secondary | ICD-10-CM | POA: Diagnosis not present

## 2018-12-16 DIAGNOSIS — E871 Hypo-osmolality and hyponatremia: Secondary | ICD-10-CM | POA: Diagnosis not present

## 2018-12-16 DIAGNOSIS — R69 Illness, unspecified: Secondary | ICD-10-CM | POA: Diagnosis not present

## 2018-12-16 DIAGNOSIS — G40901 Epilepsy, unspecified, not intractable, with status epilepticus: Secondary | ICD-10-CM | POA: Diagnosis not present

## 2018-12-16 DIAGNOSIS — I6523 Occlusion and stenosis of bilateral carotid arteries: Secondary | ICD-10-CM | POA: Diagnosis not present

## 2018-12-17 DIAGNOSIS — I10 Essential (primary) hypertension: Secondary | ICD-10-CM | POA: Diagnosis not present

## 2018-12-17 DIAGNOSIS — G40901 Epilepsy, unspecified, not intractable, with status epilepticus: Secondary | ICD-10-CM | POA: Diagnosis not present

## 2018-12-17 DIAGNOSIS — R69 Illness, unspecified: Secondary | ICD-10-CM | POA: Diagnosis not present

## 2018-12-17 DIAGNOSIS — I6522 Occlusion and stenosis of left carotid artery: Secondary | ICD-10-CM | POA: Diagnosis not present

## 2018-12-17 DIAGNOSIS — E871 Hypo-osmolality and hyponatremia: Secondary | ICD-10-CM | POA: Diagnosis not present

## 2018-12-17 DIAGNOSIS — I6523 Occlusion and stenosis of bilateral carotid arteries: Secondary | ICD-10-CM | POA: Diagnosis not present

## 2018-12-18 DIAGNOSIS — M86172 Other acute osteomyelitis, left ankle and foot: Secondary | ICD-10-CM | POA: Diagnosis not present

## 2018-12-18 DIAGNOSIS — R569 Unspecified convulsions: Secondary | ICD-10-CM | POA: Diagnosis not present

## 2018-12-18 DIAGNOSIS — M86672 Other chronic osteomyelitis, left ankle and foot: Secondary | ICD-10-CM | POA: Diagnosis not present

## 2018-12-18 DIAGNOSIS — Z720 Tobacco use: Secondary | ICD-10-CM | POA: Diagnosis not present

## 2018-12-18 DIAGNOSIS — G40901 Epilepsy, unspecified, not intractable, with status epilepticus: Secondary | ICD-10-CM | POA: Diagnosis not present

## 2018-12-18 DIAGNOSIS — I10 Essential (primary) hypertension: Secondary | ICD-10-CM | POA: Diagnosis not present

## 2018-12-18 DIAGNOSIS — I6522 Occlusion and stenosis of left carotid artery: Secondary | ICD-10-CM | POA: Diagnosis not present

## 2018-12-18 DIAGNOSIS — I739 Peripheral vascular disease, unspecified: Secondary | ICD-10-CM | POA: Diagnosis not present

## 2018-12-18 DIAGNOSIS — R69 Illness, unspecified: Secondary | ICD-10-CM | POA: Diagnosis not present

## 2018-12-18 DIAGNOSIS — E871 Hypo-osmolality and hyponatremia: Secondary | ICD-10-CM | POA: Diagnosis not present

## 2018-12-18 DIAGNOSIS — I6523 Occlusion and stenosis of bilateral carotid arteries: Secondary | ICD-10-CM | POA: Diagnosis not present

## 2018-12-19 DIAGNOSIS — R69 Illness, unspecified: Secondary | ICD-10-CM | POA: Diagnosis not present

## 2018-12-19 DIAGNOSIS — I739 Peripheral vascular disease, unspecified: Secondary | ICD-10-CM | POA: Diagnosis not present

## 2018-12-19 DIAGNOSIS — L97509 Non-pressure chronic ulcer of other part of unspecified foot with unspecified severity: Secondary | ICD-10-CM | POA: Diagnosis not present

## 2018-12-19 DIAGNOSIS — R5383 Other fatigue: Secondary | ICD-10-CM | POA: Diagnosis not present

## 2018-12-19 DIAGNOSIS — M8580 Other specified disorders of bone density and structure, unspecified site: Secondary | ICD-10-CM | POA: Diagnosis not present

## 2018-12-19 DIAGNOSIS — M8660 Other chronic osteomyelitis, unspecified site: Secondary | ICD-10-CM | POA: Diagnosis not present

## 2018-12-19 DIAGNOSIS — I2510 Atherosclerotic heart disease of native coronary artery without angina pectoris: Secondary | ICD-10-CM | POA: Diagnosis not present

## 2018-12-19 DIAGNOSIS — E871 Hypo-osmolality and hyponatremia: Secondary | ICD-10-CM | POA: Diagnosis not present

## 2018-12-19 DIAGNOSIS — I10 Essential (primary) hypertension: Secondary | ICD-10-CM | POA: Diagnosis not present

## 2018-12-19 DIAGNOSIS — R4182 Altered mental status, unspecified: Secondary | ICD-10-CM | POA: Diagnosis not present

## 2018-12-19 DIAGNOSIS — R569 Unspecified convulsions: Secondary | ICD-10-CM | POA: Diagnosis not present

## 2018-12-20 DIAGNOSIS — I739 Peripheral vascular disease, unspecified: Secondary | ICD-10-CM | POA: Diagnosis not present

## 2018-12-20 DIAGNOSIS — I10 Essential (primary) hypertension: Secondary | ICD-10-CM | POA: Diagnosis not present

## 2018-12-20 DIAGNOSIS — E871 Hypo-osmolality and hyponatremia: Secondary | ICD-10-CM | POA: Diagnosis not present

## 2018-12-20 DIAGNOSIS — R69 Illness, unspecified: Secondary | ICD-10-CM | POA: Diagnosis not present

## 2018-12-20 DIAGNOSIS — M8660 Other chronic osteomyelitis, unspecified site: Secondary | ICD-10-CM | POA: Diagnosis not present

## 2018-12-21 DIAGNOSIS — E871 Hypo-osmolality and hyponatremia: Secondary | ICD-10-CM | POA: Diagnosis not present

## 2018-12-21 DIAGNOSIS — M86172 Other acute osteomyelitis, left ankle and foot: Secondary | ICD-10-CM | POA: Diagnosis not present

## 2018-12-21 DIAGNOSIS — M8660 Other chronic osteomyelitis, unspecified site: Secondary | ICD-10-CM | POA: Diagnosis not present

## 2018-12-21 DIAGNOSIS — I739 Peripheral vascular disease, unspecified: Secondary | ICD-10-CM | POA: Diagnosis not present

## 2018-12-21 DIAGNOSIS — R69 Illness, unspecified: Secondary | ICD-10-CM | POA: Diagnosis not present

## 2018-12-21 DIAGNOSIS — I10 Essential (primary) hypertension: Secondary | ICD-10-CM | POA: Diagnosis not present

## 2018-12-22 DIAGNOSIS — I10 Essential (primary) hypertension: Secondary | ICD-10-CM | POA: Diagnosis not present

## 2018-12-22 DIAGNOSIS — E871 Hypo-osmolality and hyponatremia: Secondary | ICD-10-CM | POA: Diagnosis not present

## 2018-12-22 DIAGNOSIS — I739 Peripheral vascular disease, unspecified: Secondary | ICD-10-CM | POA: Diagnosis not present

## 2018-12-22 DIAGNOSIS — R69 Illness, unspecified: Secondary | ICD-10-CM | POA: Diagnosis not present

## 2018-12-22 DIAGNOSIS — M8660 Other chronic osteomyelitis, unspecified site: Secondary | ICD-10-CM | POA: Diagnosis not present

## 2018-12-23 DIAGNOSIS — I739 Peripheral vascular disease, unspecified: Secondary | ICD-10-CM | POA: Diagnosis not present

## 2018-12-23 DIAGNOSIS — M86172 Other acute osteomyelitis, left ankle and foot: Secondary | ICD-10-CM | POA: Diagnosis not present

## 2018-12-23 DIAGNOSIS — J441 Chronic obstructive pulmonary disease with (acute) exacerbation: Secondary | ICD-10-CM | POA: Diagnosis not present

## 2018-12-23 DIAGNOSIS — G8918 Other acute postprocedural pain: Secondary | ICD-10-CM | POA: Diagnosis not present

## 2018-12-23 DIAGNOSIS — L97519 Non-pressure chronic ulcer of other part of right foot with unspecified severity: Secondary | ICD-10-CM | POA: Diagnosis not present

## 2018-12-23 DIAGNOSIS — I7409 Other arterial embolism and thrombosis of abdominal aorta: Secondary | ICD-10-CM | POA: Diagnosis not present

## 2018-12-23 DIAGNOSIS — E871 Hypo-osmolality and hyponatremia: Secondary | ICD-10-CM | POA: Diagnosis not present

## 2018-12-23 DIAGNOSIS — R69 Illness, unspecified: Secondary | ICD-10-CM | POA: Diagnosis not present

## 2018-12-23 DIAGNOSIS — I96 Gangrene, not elsewhere classified: Secondary | ICD-10-CM | POA: Diagnosis not present

## 2018-12-23 DIAGNOSIS — I10 Essential (primary) hypertension: Secondary | ICD-10-CM | POA: Diagnosis not present

## 2018-12-23 DIAGNOSIS — M8660 Other chronic osteomyelitis, unspecified site: Secondary | ICD-10-CM | POA: Diagnosis not present

## 2018-12-23 DIAGNOSIS — L97529 Non-pressure chronic ulcer of other part of left foot with unspecified severity: Secondary | ICD-10-CM | POA: Diagnosis not present

## 2018-12-23 DIAGNOSIS — M86171 Other acute osteomyelitis, right ankle and foot: Secondary | ICD-10-CM | POA: Diagnosis not present

## 2018-12-24 DIAGNOSIS — I10 Essential (primary) hypertension: Secondary | ICD-10-CM | POA: Diagnosis not present

## 2018-12-25 DIAGNOSIS — M869 Osteomyelitis, unspecified: Secondary | ICD-10-CM | POA: Diagnosis not present

## 2018-12-25 DIAGNOSIS — I10 Essential (primary) hypertension: Secondary | ICD-10-CM | POA: Diagnosis not present

## 2018-12-26 DIAGNOSIS — I10 Essential (primary) hypertension: Secondary | ICD-10-CM | POA: Diagnosis not present

## 2018-12-27 DIAGNOSIS — I10 Essential (primary) hypertension: Secondary | ICD-10-CM | POA: Diagnosis not present

## 2018-12-27 DIAGNOSIS — M8660 Other chronic osteomyelitis, unspecified site: Secondary | ICD-10-CM | POA: Diagnosis not present

## 2018-12-28 DIAGNOSIS — I10 Essential (primary) hypertension: Secondary | ICD-10-CM | POA: Diagnosis not present

## 2018-12-29 DIAGNOSIS — G8918 Other acute postprocedural pain: Secondary | ICD-10-CM | POA: Diagnosis not present

## 2018-12-29 DIAGNOSIS — I7409 Other arterial embolism and thrombosis of abdominal aorta: Secondary | ICD-10-CM | POA: Diagnosis not present

## 2018-12-29 DIAGNOSIS — M86172 Other acute osteomyelitis, left ankle and foot: Secondary | ICD-10-CM | POA: Diagnosis not present

## 2018-12-29 DIAGNOSIS — I739 Peripheral vascular disease, unspecified: Secondary | ICD-10-CM | POA: Diagnosis not present

## 2018-12-29 DIAGNOSIS — J9811 Atelectasis: Secondary | ICD-10-CM | POA: Diagnosis not present

## 2018-12-29 DIAGNOSIS — I10 Essential (primary) hypertension: Secondary | ICD-10-CM | POA: Diagnosis not present

## 2018-12-30 DIAGNOSIS — G8918 Other acute postprocedural pain: Secondary | ICD-10-CM | POA: Diagnosis not present

## 2018-12-30 DIAGNOSIS — I959 Hypotension, unspecified: Secondary | ICD-10-CM | POA: Diagnosis not present

## 2018-12-30 DIAGNOSIS — S79922A Unspecified injury of left thigh, initial encounter: Secondary | ICD-10-CM | POA: Diagnosis not present

## 2018-12-30 DIAGNOSIS — R69 Illness, unspecified: Secondary | ICD-10-CM | POA: Diagnosis not present

## 2018-12-30 DIAGNOSIS — E43 Unspecified severe protein-calorie malnutrition: Secondary | ICD-10-CM | POA: Diagnosis not present

## 2018-12-30 DIAGNOSIS — S79912A Unspecified injury of left hip, initial encounter: Secondary | ICD-10-CM | POA: Diagnosis not present

## 2018-12-30 DIAGNOSIS — J449 Chronic obstructive pulmonary disease, unspecified: Secondary | ICD-10-CM | POA: Diagnosis not present

## 2018-12-30 DIAGNOSIS — R0902 Hypoxemia: Secondary | ICD-10-CM | POA: Diagnosis not present

## 2018-12-30 DIAGNOSIS — W19XXXA Unspecified fall, initial encounter: Secondary | ICD-10-CM | POA: Diagnosis not present

## 2018-12-30 DIAGNOSIS — J95821 Acute postprocedural respiratory failure: Secondary | ICD-10-CM | POA: Diagnosis not present

## 2018-12-30 DIAGNOSIS — I10 Essential (primary) hypertension: Secondary | ICD-10-CM | POA: Diagnosis not present

## 2018-12-30 DIAGNOSIS — R569 Unspecified convulsions: Secondary | ICD-10-CM | POA: Diagnosis not present

## 2018-12-30 DIAGNOSIS — D649 Anemia, unspecified: Secondary | ICD-10-CM | POA: Diagnosis not present

## 2018-12-31 DIAGNOSIS — I10 Essential (primary) hypertension: Secondary | ICD-10-CM | POA: Diagnosis not present

## 2018-12-31 DIAGNOSIS — J449 Chronic obstructive pulmonary disease, unspecified: Secondary | ICD-10-CM | POA: Diagnosis not present

## 2018-12-31 DIAGNOSIS — G4700 Insomnia, unspecified: Secondary | ICD-10-CM | POA: Diagnosis not present

## 2018-12-31 DIAGNOSIS — J95821 Acute postprocedural respiratory failure: Secondary | ICD-10-CM | POA: Diagnosis not present

## 2018-12-31 DIAGNOSIS — I959 Hypotension, unspecified: Secondary | ICD-10-CM | POA: Diagnosis not present

## 2018-12-31 DIAGNOSIS — G8918 Other acute postprocedural pain: Secondary | ICD-10-CM | POA: Diagnosis not present

## 2018-12-31 DIAGNOSIS — R69 Illness, unspecified: Secondary | ICD-10-CM | POA: Diagnosis not present

## 2018-12-31 DIAGNOSIS — D649 Anemia, unspecified: Secondary | ICD-10-CM | POA: Diagnosis not present

## 2018-12-31 DIAGNOSIS — R569 Unspecified convulsions: Secondary | ICD-10-CM | POA: Diagnosis not present

## 2019-01-01 DIAGNOSIS — D649 Anemia, unspecified: Secondary | ICD-10-CM | POA: Diagnosis not present

## 2019-01-01 DIAGNOSIS — R69 Illness, unspecified: Secondary | ICD-10-CM | POA: Diagnosis not present

## 2019-01-01 DIAGNOSIS — R569 Unspecified convulsions: Secondary | ICD-10-CM | POA: Diagnosis not present

## 2019-01-01 DIAGNOSIS — J95821 Acute postprocedural respiratory failure: Secondary | ICD-10-CM | POA: Diagnosis not present

## 2019-01-01 DIAGNOSIS — G4700 Insomnia, unspecified: Secondary | ICD-10-CM | POA: Diagnosis not present

## 2019-01-01 DIAGNOSIS — I959 Hypotension, unspecified: Secondary | ICD-10-CM | POA: Diagnosis not present

## 2019-01-01 DIAGNOSIS — J449 Chronic obstructive pulmonary disease, unspecified: Secondary | ICD-10-CM | POA: Diagnosis not present

## 2019-01-01 DIAGNOSIS — G8918 Other acute postprocedural pain: Secondary | ICD-10-CM | POA: Diagnosis not present

## 2019-01-01 DIAGNOSIS — I10 Essential (primary) hypertension: Secondary | ICD-10-CM | POA: Diagnosis not present

## 2019-01-02 DIAGNOSIS — I10 Essential (primary) hypertension: Secondary | ICD-10-CM | POA: Diagnosis not present

## 2019-01-02 DIAGNOSIS — I9581 Postprocedural hypotension: Secondary | ICD-10-CM | POA: Diagnosis not present

## 2019-01-02 DIAGNOSIS — I739 Peripheral vascular disease, unspecified: Secondary | ICD-10-CM | POA: Diagnosis not present

## 2019-01-02 DIAGNOSIS — M86172 Other acute osteomyelitis, left ankle and foot: Secondary | ICD-10-CM | POA: Diagnosis not present

## 2019-01-02 DIAGNOSIS — J90 Pleural effusion, not elsewhere classified: Secondary | ICD-10-CM | POA: Diagnosis not present

## 2019-01-02 DIAGNOSIS — J441 Chronic obstructive pulmonary disease with (acute) exacerbation: Secondary | ICD-10-CM | POA: Diagnosis not present

## 2019-01-02 DIAGNOSIS — R918 Other nonspecific abnormal finding of lung field: Secondary | ICD-10-CM | POA: Diagnosis not present

## 2019-01-02 DIAGNOSIS — I2510 Atherosclerotic heart disease of native coronary artery without angina pectoris: Secondary | ICD-10-CM | POA: Diagnosis not present

## 2019-01-02 DIAGNOSIS — E2740 Unspecified adrenocortical insufficiency: Secondary | ICD-10-CM | POA: Diagnosis not present

## 2019-01-03 DIAGNOSIS — J9811 Atelectasis: Secondary | ICD-10-CM | POA: Diagnosis not present

## 2019-01-03 DIAGNOSIS — E2740 Unspecified adrenocortical insufficiency: Secondary | ICD-10-CM | POA: Diagnosis not present

## 2019-01-03 DIAGNOSIS — J449 Chronic obstructive pulmonary disease, unspecified: Secondary | ICD-10-CM | POA: Diagnosis not present

## 2019-01-03 DIAGNOSIS — I10 Essential (primary) hypertension: Secondary | ICD-10-CM | POA: Diagnosis not present

## 2019-01-03 DIAGNOSIS — J90 Pleural effusion, not elsewhere classified: Secondary | ICD-10-CM | POA: Diagnosis not present

## 2019-01-03 DIAGNOSIS — R0689 Other abnormalities of breathing: Secondary | ICD-10-CM | POA: Diagnosis not present

## 2019-01-03 DIAGNOSIS — R918 Other nonspecific abnormal finding of lung field: Secondary | ICD-10-CM | POA: Diagnosis not present

## 2019-01-03 DIAGNOSIS — R0600 Dyspnea, unspecified: Secondary | ICD-10-CM | POA: Diagnosis not present

## 2019-01-03 DIAGNOSIS — I9581 Postprocedural hypotension: Secondary | ICD-10-CM | POA: Diagnosis not present

## 2019-01-04 DIAGNOSIS — I10 Essential (primary) hypertension: Secondary | ICD-10-CM | POA: Diagnosis not present

## 2019-01-05 DIAGNOSIS — I7389 Other specified peripheral vascular diseases: Secondary | ICD-10-CM | POA: Diagnosis not present

## 2019-01-05 DIAGNOSIS — I739 Peripheral vascular disease, unspecified: Secondary | ICD-10-CM | POA: Diagnosis not present

## 2019-01-05 DIAGNOSIS — Z89422 Acquired absence of other left toe(s): Secondary | ICD-10-CM | POA: Diagnosis not present

## 2019-01-05 DIAGNOSIS — I10 Essential (primary) hypertension: Secondary | ICD-10-CM | POA: Diagnosis not present

## 2019-01-06 DIAGNOSIS — I739 Peripheral vascular disease, unspecified: Secondary | ICD-10-CM | POA: Diagnosis not present

## 2019-01-08 DIAGNOSIS — I10 Essential (primary) hypertension: Secondary | ICD-10-CM | POA: Diagnosis not present

## 2019-01-10 DIAGNOSIS — D72829 Elevated white blood cell count, unspecified: Secondary | ICD-10-CM | POA: Diagnosis not present

## 2019-01-12 DIAGNOSIS — Z89422 Acquired absence of other left toe(s): Secondary | ICD-10-CM | POA: Diagnosis not present

## 2019-01-12 DIAGNOSIS — M6281 Muscle weakness (generalized): Secondary | ICD-10-CM | POA: Diagnosis not present

## 2019-01-12 DIAGNOSIS — S91302A Unspecified open wound, left foot, initial encounter: Secondary | ICD-10-CM | POA: Diagnosis not present

## 2019-01-12 DIAGNOSIS — J441 Chronic obstructive pulmonary disease with (acute) exacerbation: Secondary | ICD-10-CM | POA: Diagnosis not present

## 2019-01-12 DIAGNOSIS — Z89411 Acquired absence of right great toe: Secondary | ICD-10-CM | POA: Diagnosis not present

## 2019-01-12 DIAGNOSIS — I10 Essential (primary) hypertension: Secondary | ICD-10-CM | POA: Diagnosis not present

## 2019-01-12 DIAGNOSIS — T8189XA Other complications of procedures, not elsewhere classified, initial encounter: Secondary | ICD-10-CM | POA: Diagnosis not present

## 2019-01-12 DIAGNOSIS — R1084 Generalized abdominal pain: Secondary | ICD-10-CM | POA: Diagnosis not present

## 2019-01-12 DIAGNOSIS — Z9181 History of falling: Secondary | ICD-10-CM | POA: Diagnosis not present

## 2019-01-12 DIAGNOSIS — G4089 Other seizures: Secondary | ICD-10-CM | POA: Diagnosis not present

## 2019-01-12 DIAGNOSIS — R269 Unspecified abnormalities of gait and mobility: Secondary | ICD-10-CM | POA: Diagnosis not present

## 2019-01-12 DIAGNOSIS — D649 Anemia, unspecified: Secondary | ICD-10-CM | POA: Diagnosis not present

## 2019-01-12 DIAGNOSIS — G40501 Epileptic seizures related to external causes, not intractable, with status epilepticus: Secondary | ICD-10-CM | POA: Diagnosis not present

## 2019-01-12 DIAGNOSIS — R5381 Other malaise: Secondary | ICD-10-CM | POA: Diagnosis not present

## 2019-01-12 DIAGNOSIS — R41841 Cognitive communication deficit: Secondary | ICD-10-CM | POA: Diagnosis not present

## 2019-01-12 DIAGNOSIS — G9340 Encephalopathy, unspecified: Secondary | ICD-10-CM | POA: Diagnosis not present

## 2019-01-13 DIAGNOSIS — R5381 Other malaise: Secondary | ICD-10-CM | POA: Diagnosis not present

## 2019-01-16 DIAGNOSIS — R5381 Other malaise: Secondary | ICD-10-CM | POA: Diagnosis not present

## 2019-01-25 DIAGNOSIS — R5381 Other malaise: Secondary | ICD-10-CM | POA: Diagnosis not present

## 2019-01-28 DIAGNOSIS — I739 Peripheral vascular disease, unspecified: Secondary | ICD-10-CM | POA: Diagnosis not present

## 2019-01-28 DIAGNOSIS — T8789 Other complications of amputation stump: Secondary | ICD-10-CM | POA: Diagnosis not present

## 2019-01-28 DIAGNOSIS — J449 Chronic obstructive pulmonary disease, unspecified: Secondary | ICD-10-CM | POA: Diagnosis not present

## 2019-01-28 DIAGNOSIS — G40901 Epilepsy, unspecified, not intractable, with status epilepticus: Secondary | ICD-10-CM | POA: Diagnosis not present

## 2019-01-28 DIAGNOSIS — J9611 Chronic respiratory failure with hypoxia: Secondary | ICD-10-CM | POA: Diagnosis not present

## 2019-01-28 DIAGNOSIS — I10 Essential (primary) hypertension: Secondary | ICD-10-CM | POA: Diagnosis not present

## 2019-01-28 DIAGNOSIS — R69 Illness, unspecified: Secondary | ICD-10-CM | POA: Diagnosis not present

## 2019-01-28 DIAGNOSIS — R7303 Prediabetes: Secondary | ICD-10-CM | POA: Diagnosis not present

## 2019-01-29 DIAGNOSIS — T8789 Other complications of amputation stump: Secondary | ICD-10-CM | POA: Diagnosis not present

## 2019-01-29 DIAGNOSIS — I739 Peripheral vascular disease, unspecified: Secondary | ICD-10-CM | POA: Diagnosis not present

## 2019-01-29 DIAGNOSIS — J449 Chronic obstructive pulmonary disease, unspecified: Secondary | ICD-10-CM | POA: Diagnosis not present

## 2019-01-29 DIAGNOSIS — G40901 Epilepsy, unspecified, not intractable, with status epilepticus: Secondary | ICD-10-CM | POA: Diagnosis not present

## 2019-01-29 DIAGNOSIS — R7303 Prediabetes: Secondary | ICD-10-CM | POA: Diagnosis not present

## 2019-01-29 DIAGNOSIS — J9611 Chronic respiratory failure with hypoxia: Secondary | ICD-10-CM | POA: Diagnosis not present

## 2019-01-29 DIAGNOSIS — I10 Essential (primary) hypertension: Secondary | ICD-10-CM | POA: Diagnosis not present

## 2019-01-29 DIAGNOSIS — R69 Illness, unspecified: Secondary | ICD-10-CM | POA: Diagnosis not present

## 2019-01-30 DIAGNOSIS — G40901 Epilepsy, unspecified, not intractable, with status epilepticus: Secondary | ICD-10-CM | POA: Diagnosis not present

## 2019-01-30 DIAGNOSIS — R69 Illness, unspecified: Secondary | ICD-10-CM | POA: Diagnosis not present

## 2019-01-30 DIAGNOSIS — R7303 Prediabetes: Secondary | ICD-10-CM | POA: Diagnosis not present

## 2019-01-30 DIAGNOSIS — I739 Peripheral vascular disease, unspecified: Secondary | ICD-10-CM | POA: Diagnosis not present

## 2019-01-30 DIAGNOSIS — R0902 Hypoxemia: Secondary | ICD-10-CM | POA: Diagnosis not present

## 2019-01-30 DIAGNOSIS — J449 Chronic obstructive pulmonary disease, unspecified: Secondary | ICD-10-CM | POA: Diagnosis not present

## 2019-01-30 DIAGNOSIS — I10 Essential (primary) hypertension: Secondary | ICD-10-CM | POA: Diagnosis not present

## 2019-01-30 DIAGNOSIS — T8789 Other complications of amputation stump: Secondary | ICD-10-CM | POA: Diagnosis not present

## 2019-01-30 DIAGNOSIS — J9611 Chronic respiratory failure with hypoxia: Secondary | ICD-10-CM | POA: Diagnosis not present

## 2019-01-31 DIAGNOSIS — R7303 Prediabetes: Secondary | ICD-10-CM | POA: Diagnosis not present

## 2019-01-31 DIAGNOSIS — T8789 Other complications of amputation stump: Secondary | ICD-10-CM | POA: Diagnosis not present

## 2019-01-31 DIAGNOSIS — I739 Peripheral vascular disease, unspecified: Secondary | ICD-10-CM | POA: Diagnosis not present

## 2019-01-31 DIAGNOSIS — J9611 Chronic respiratory failure with hypoxia: Secondary | ICD-10-CM | POA: Diagnosis not present

## 2019-01-31 DIAGNOSIS — I10 Essential (primary) hypertension: Secondary | ICD-10-CM | POA: Diagnosis not present

## 2019-01-31 DIAGNOSIS — R69 Illness, unspecified: Secondary | ICD-10-CM | POA: Diagnosis not present

## 2019-01-31 DIAGNOSIS — J449 Chronic obstructive pulmonary disease, unspecified: Secondary | ICD-10-CM | POA: Diagnosis not present

## 2019-01-31 DIAGNOSIS — G40901 Epilepsy, unspecified, not intractable, with status epilepticus: Secondary | ICD-10-CM | POA: Diagnosis not present

## 2019-02-01 DIAGNOSIS — G40901 Epilepsy, unspecified, not intractable, with status epilepticus: Secondary | ICD-10-CM | POA: Diagnosis not present

## 2019-02-01 DIAGNOSIS — R69 Illness, unspecified: Secondary | ICD-10-CM | POA: Diagnosis not present

## 2019-02-01 DIAGNOSIS — T8789 Other complications of amputation stump: Secondary | ICD-10-CM | POA: Diagnosis not present

## 2019-02-01 DIAGNOSIS — J449 Chronic obstructive pulmonary disease, unspecified: Secondary | ICD-10-CM | POA: Diagnosis not present

## 2019-02-01 DIAGNOSIS — I10 Essential (primary) hypertension: Secondary | ICD-10-CM | POA: Diagnosis not present

## 2019-02-01 DIAGNOSIS — R7303 Prediabetes: Secondary | ICD-10-CM | POA: Diagnosis not present

## 2019-02-01 DIAGNOSIS — I739 Peripheral vascular disease, unspecified: Secondary | ICD-10-CM | POA: Diagnosis not present

## 2019-02-01 DIAGNOSIS — J9611 Chronic respiratory failure with hypoxia: Secondary | ICD-10-CM | POA: Diagnosis not present

## 2019-02-03 DIAGNOSIS — Z4889 Encounter for other specified surgical aftercare: Secondary | ICD-10-CM | POA: Diagnosis not present

## 2019-02-03 DIAGNOSIS — Z89421 Acquired absence of other right toe(s): Secondary | ICD-10-CM | POA: Diagnosis not present

## 2019-02-03 DIAGNOSIS — I739 Peripheral vascular disease, unspecified: Secondary | ICD-10-CM | POA: Diagnosis not present

## 2019-02-03 DIAGNOSIS — Z89412 Acquired absence of left great toe: Secondary | ICD-10-CM | POA: Diagnosis not present

## 2019-02-06 DIAGNOSIS — I739 Peripheral vascular disease, unspecified: Secondary | ICD-10-CM | POA: Diagnosis not present

## 2019-02-06 DIAGNOSIS — I10 Essential (primary) hypertension: Secondary | ICD-10-CM | POA: Diagnosis not present

## 2019-02-06 DIAGNOSIS — R69 Illness, unspecified: Secondary | ICD-10-CM | POA: Diagnosis not present

## 2019-02-06 DIAGNOSIS — R7303 Prediabetes: Secondary | ICD-10-CM | POA: Diagnosis not present

## 2019-02-06 DIAGNOSIS — J449 Chronic obstructive pulmonary disease, unspecified: Secondary | ICD-10-CM | POA: Diagnosis not present

## 2019-02-06 DIAGNOSIS — T8789 Other complications of amputation stump: Secondary | ICD-10-CM | POA: Diagnosis not present

## 2019-02-06 DIAGNOSIS — G40901 Epilepsy, unspecified, not intractable, with status epilepticus: Secondary | ICD-10-CM | POA: Diagnosis not present

## 2019-02-06 DIAGNOSIS — J9611 Chronic respiratory failure with hypoxia: Secondary | ICD-10-CM | POA: Diagnosis not present

## 2019-02-07 DIAGNOSIS — Z01818 Encounter for other preprocedural examination: Secondary | ICD-10-CM | POA: Diagnosis not present

## 2019-02-07 DIAGNOSIS — Z1159 Encounter for screening for other viral diseases: Secondary | ICD-10-CM | POA: Diagnosis not present

## 2019-02-08 DIAGNOSIS — I10 Essential (primary) hypertension: Secondary | ICD-10-CM | POA: Diagnosis not present

## 2019-02-08 DIAGNOSIS — R69 Illness, unspecified: Secondary | ICD-10-CM | POA: Diagnosis not present

## 2019-02-08 DIAGNOSIS — J449 Chronic obstructive pulmonary disease, unspecified: Secondary | ICD-10-CM | POA: Diagnosis not present

## 2019-02-08 DIAGNOSIS — J9611 Chronic respiratory failure with hypoxia: Secondary | ICD-10-CM | POA: Diagnosis not present

## 2019-02-08 DIAGNOSIS — T8789 Other complications of amputation stump: Secondary | ICD-10-CM | POA: Diagnosis not present

## 2019-02-08 DIAGNOSIS — I739 Peripheral vascular disease, unspecified: Secondary | ICD-10-CM | POA: Diagnosis not present

## 2019-02-08 DIAGNOSIS — G40901 Epilepsy, unspecified, not intractable, with status epilepticus: Secondary | ICD-10-CM | POA: Diagnosis not present

## 2019-02-08 DIAGNOSIS — R7303 Prediabetes: Secondary | ICD-10-CM | POA: Diagnosis not present

## 2019-02-09 DIAGNOSIS — I739 Peripheral vascular disease, unspecified: Secondary | ICD-10-CM | POA: Diagnosis not present

## 2019-02-09 DIAGNOSIS — J449 Chronic obstructive pulmonary disease, unspecified: Secondary | ICD-10-CM | POA: Diagnosis not present

## 2019-02-09 DIAGNOSIS — Z89412 Acquired absence of left great toe: Secondary | ICD-10-CM | POA: Diagnosis not present

## 2019-02-09 DIAGNOSIS — Z87891 Personal history of nicotine dependence: Secondary | ICD-10-CM | POA: Diagnosis not present

## 2019-02-09 DIAGNOSIS — R69 Illness, unspecified: Secondary | ICD-10-CM | POA: Diagnosis not present

## 2019-02-09 DIAGNOSIS — Z888 Allergy status to other drugs, medicaments and biological substances status: Secondary | ICD-10-CM | POA: Diagnosis not present

## 2019-02-09 DIAGNOSIS — Z881 Allergy status to other antibiotic agents status: Secondary | ICD-10-CM | POA: Diagnosis not present

## 2019-02-09 DIAGNOSIS — Z89421 Acquired absence of other right toe(s): Secondary | ICD-10-CM | POA: Diagnosis not present

## 2019-02-09 DIAGNOSIS — Y835 Amputation of limb(s) as the cause of abnormal reaction of the patient, or of later complication, without mention of misadventure at the time of the procedure: Secondary | ICD-10-CM | POA: Diagnosis not present

## 2019-02-09 DIAGNOSIS — M795 Residual foreign body in soft tissue: Secondary | ICD-10-CM | POA: Diagnosis not present

## 2019-02-09 DIAGNOSIS — Z89411 Acquired absence of right great toe: Secondary | ICD-10-CM | POA: Diagnosis not present

## 2019-02-09 DIAGNOSIS — E785 Hyperlipidemia, unspecified: Secondary | ICD-10-CM | POA: Diagnosis not present

## 2019-02-09 DIAGNOSIS — T8789 Other complications of amputation stump: Secondary | ICD-10-CM | POA: Diagnosis not present

## 2019-02-09 DIAGNOSIS — R7303 Prediabetes: Secondary | ICD-10-CM | POA: Diagnosis not present

## 2019-02-10 DIAGNOSIS — R7303 Prediabetes: Secondary | ICD-10-CM | POA: Diagnosis not present

## 2019-02-10 DIAGNOSIS — J449 Chronic obstructive pulmonary disease, unspecified: Secondary | ICD-10-CM | POA: Diagnosis not present

## 2019-02-10 DIAGNOSIS — I10 Essential (primary) hypertension: Secondary | ICD-10-CM | POA: Diagnosis not present

## 2019-02-10 DIAGNOSIS — G40901 Epilepsy, unspecified, not intractable, with status epilepticus: Secondary | ICD-10-CM | POA: Diagnosis not present

## 2019-02-10 DIAGNOSIS — J9611 Chronic respiratory failure with hypoxia: Secondary | ICD-10-CM | POA: Diagnosis not present

## 2019-02-10 DIAGNOSIS — R69 Illness, unspecified: Secondary | ICD-10-CM | POA: Diagnosis not present

## 2019-02-10 DIAGNOSIS — I739 Peripheral vascular disease, unspecified: Secondary | ICD-10-CM | POA: Diagnosis not present

## 2019-02-10 DIAGNOSIS — T8789 Other complications of amputation stump: Secondary | ICD-10-CM | POA: Diagnosis not present

## 2019-02-13 DIAGNOSIS — I739 Peripheral vascular disease, unspecified: Secondary | ICD-10-CM | POA: Diagnosis not present

## 2019-02-13 DIAGNOSIS — R7303 Prediabetes: Secondary | ICD-10-CM | POA: Diagnosis not present

## 2019-02-13 DIAGNOSIS — R69 Illness, unspecified: Secondary | ICD-10-CM | POA: Diagnosis not present

## 2019-02-13 DIAGNOSIS — T8789 Other complications of amputation stump: Secondary | ICD-10-CM | POA: Diagnosis not present

## 2019-02-13 DIAGNOSIS — I10 Essential (primary) hypertension: Secondary | ICD-10-CM | POA: Diagnosis not present

## 2019-02-13 DIAGNOSIS — G40901 Epilepsy, unspecified, not intractable, with status epilepticus: Secondary | ICD-10-CM | POA: Diagnosis not present

## 2019-02-13 DIAGNOSIS — J9611 Chronic respiratory failure with hypoxia: Secondary | ICD-10-CM | POA: Diagnosis not present

## 2019-02-13 DIAGNOSIS — J449 Chronic obstructive pulmonary disease, unspecified: Secondary | ICD-10-CM | POA: Diagnosis not present

## 2019-02-14 DIAGNOSIS — T8789 Other complications of amputation stump: Secondary | ICD-10-CM | POA: Diagnosis not present

## 2019-02-14 DIAGNOSIS — R7303 Prediabetes: Secondary | ICD-10-CM | POA: Diagnosis not present

## 2019-02-14 DIAGNOSIS — J449 Chronic obstructive pulmonary disease, unspecified: Secondary | ICD-10-CM | POA: Diagnosis not present

## 2019-02-14 DIAGNOSIS — R69 Illness, unspecified: Secondary | ICD-10-CM | POA: Diagnosis not present

## 2019-02-14 DIAGNOSIS — G40901 Epilepsy, unspecified, not intractable, with status epilepticus: Secondary | ICD-10-CM | POA: Diagnosis not present

## 2019-02-14 DIAGNOSIS — I739 Peripheral vascular disease, unspecified: Secondary | ICD-10-CM | POA: Diagnosis not present

## 2019-02-14 DIAGNOSIS — J9611 Chronic respiratory failure with hypoxia: Secondary | ICD-10-CM | POA: Diagnosis not present

## 2019-02-14 DIAGNOSIS — I10 Essential (primary) hypertension: Secondary | ICD-10-CM | POA: Diagnosis not present

## 2019-02-15 DIAGNOSIS — I10 Essential (primary) hypertension: Secondary | ICD-10-CM | POA: Diagnosis not present

## 2019-02-15 DIAGNOSIS — T8789 Other complications of amputation stump: Secondary | ICD-10-CM | POA: Diagnosis not present

## 2019-02-15 DIAGNOSIS — J9611 Chronic respiratory failure with hypoxia: Secondary | ICD-10-CM | POA: Diagnosis not present

## 2019-02-15 DIAGNOSIS — R69 Illness, unspecified: Secondary | ICD-10-CM | POA: Diagnosis not present

## 2019-02-15 DIAGNOSIS — I739 Peripheral vascular disease, unspecified: Secondary | ICD-10-CM | POA: Diagnosis not present

## 2019-02-15 DIAGNOSIS — J449 Chronic obstructive pulmonary disease, unspecified: Secondary | ICD-10-CM | POA: Diagnosis not present

## 2019-02-15 DIAGNOSIS — R7303 Prediabetes: Secondary | ICD-10-CM | POA: Diagnosis not present

## 2019-02-15 DIAGNOSIS — G40901 Epilepsy, unspecified, not intractable, with status epilepticus: Secondary | ICD-10-CM | POA: Diagnosis not present

## 2019-02-16 DIAGNOSIS — T8789 Other complications of amputation stump: Secondary | ICD-10-CM | POA: Diagnosis not present

## 2019-02-16 DIAGNOSIS — G40901 Epilepsy, unspecified, not intractable, with status epilepticus: Secondary | ICD-10-CM | POA: Diagnosis not present

## 2019-02-16 DIAGNOSIS — J9611 Chronic respiratory failure with hypoxia: Secondary | ICD-10-CM | POA: Diagnosis not present

## 2019-02-16 DIAGNOSIS — R7303 Prediabetes: Secondary | ICD-10-CM | POA: Diagnosis not present

## 2019-02-16 DIAGNOSIS — I739 Peripheral vascular disease, unspecified: Secondary | ICD-10-CM | POA: Diagnosis not present

## 2019-02-16 DIAGNOSIS — R69 Illness, unspecified: Secondary | ICD-10-CM | POA: Diagnosis not present

## 2019-02-16 DIAGNOSIS — I10 Essential (primary) hypertension: Secondary | ICD-10-CM | POA: Diagnosis not present

## 2019-02-16 DIAGNOSIS — J449 Chronic obstructive pulmonary disease, unspecified: Secondary | ICD-10-CM | POA: Diagnosis not present

## 2019-02-17 DIAGNOSIS — T8789 Other complications of amputation stump: Secondary | ICD-10-CM | POA: Diagnosis not present

## 2019-02-17 DIAGNOSIS — I10 Essential (primary) hypertension: Secondary | ICD-10-CM | POA: Diagnosis not present

## 2019-02-17 DIAGNOSIS — G40901 Epilepsy, unspecified, not intractable, with status epilepticus: Secondary | ICD-10-CM | POA: Diagnosis not present

## 2019-02-17 DIAGNOSIS — J449 Chronic obstructive pulmonary disease, unspecified: Secondary | ICD-10-CM | POA: Diagnosis not present

## 2019-02-17 DIAGNOSIS — R69 Illness, unspecified: Secondary | ICD-10-CM | POA: Diagnosis not present

## 2019-02-17 DIAGNOSIS — R7303 Prediabetes: Secondary | ICD-10-CM | POA: Diagnosis not present

## 2019-02-17 DIAGNOSIS — I739 Peripheral vascular disease, unspecified: Secondary | ICD-10-CM | POA: Diagnosis not present

## 2019-02-17 DIAGNOSIS — J9611 Chronic respiratory failure with hypoxia: Secondary | ICD-10-CM | POA: Diagnosis not present

## 2019-02-20 DIAGNOSIS — R69 Illness, unspecified: Secondary | ICD-10-CM | POA: Diagnosis not present

## 2019-02-20 DIAGNOSIS — T8789 Other complications of amputation stump: Secondary | ICD-10-CM | POA: Diagnosis not present

## 2019-02-20 DIAGNOSIS — I10 Essential (primary) hypertension: Secondary | ICD-10-CM | POA: Diagnosis not present

## 2019-02-20 DIAGNOSIS — J449 Chronic obstructive pulmonary disease, unspecified: Secondary | ICD-10-CM | POA: Diagnosis not present

## 2019-02-20 DIAGNOSIS — I739 Peripheral vascular disease, unspecified: Secondary | ICD-10-CM | POA: Diagnosis not present

## 2019-02-20 DIAGNOSIS — R7303 Prediabetes: Secondary | ICD-10-CM | POA: Diagnosis not present

## 2019-02-20 DIAGNOSIS — G40901 Epilepsy, unspecified, not intractable, with status epilepticus: Secondary | ICD-10-CM | POA: Diagnosis not present

## 2019-02-20 DIAGNOSIS — J9611 Chronic respiratory failure with hypoxia: Secondary | ICD-10-CM | POA: Diagnosis not present

## 2019-02-21 DIAGNOSIS — R7303 Prediabetes: Secondary | ICD-10-CM | POA: Diagnosis not present

## 2019-02-21 DIAGNOSIS — R69 Illness, unspecified: Secondary | ICD-10-CM | POA: Diagnosis not present

## 2019-02-21 DIAGNOSIS — J9611 Chronic respiratory failure with hypoxia: Secondary | ICD-10-CM | POA: Diagnosis not present

## 2019-02-21 DIAGNOSIS — G40901 Epilepsy, unspecified, not intractable, with status epilepticus: Secondary | ICD-10-CM | POA: Diagnosis not present

## 2019-02-21 DIAGNOSIS — I10 Essential (primary) hypertension: Secondary | ICD-10-CM | POA: Diagnosis not present

## 2019-02-21 DIAGNOSIS — T8789 Other complications of amputation stump: Secondary | ICD-10-CM | POA: Diagnosis not present

## 2019-02-21 DIAGNOSIS — I739 Peripheral vascular disease, unspecified: Secondary | ICD-10-CM | POA: Diagnosis not present

## 2019-02-21 DIAGNOSIS — J449 Chronic obstructive pulmonary disease, unspecified: Secondary | ICD-10-CM | POA: Diagnosis not present

## 2019-02-22 DIAGNOSIS — R7303 Prediabetes: Secondary | ICD-10-CM | POA: Diagnosis not present

## 2019-02-22 DIAGNOSIS — G40901 Epilepsy, unspecified, not intractable, with status epilepticus: Secondary | ICD-10-CM | POA: Diagnosis not present

## 2019-02-22 DIAGNOSIS — I10 Essential (primary) hypertension: Secondary | ICD-10-CM | POA: Diagnosis not present

## 2019-02-22 DIAGNOSIS — J9611 Chronic respiratory failure with hypoxia: Secondary | ICD-10-CM | POA: Diagnosis not present

## 2019-02-22 DIAGNOSIS — I739 Peripheral vascular disease, unspecified: Secondary | ICD-10-CM | POA: Diagnosis not present

## 2019-02-22 DIAGNOSIS — T8789 Other complications of amputation stump: Secondary | ICD-10-CM | POA: Diagnosis not present

## 2019-02-22 DIAGNOSIS — R69 Illness, unspecified: Secondary | ICD-10-CM | POA: Diagnosis not present

## 2019-02-22 DIAGNOSIS — J449 Chronic obstructive pulmonary disease, unspecified: Secondary | ICD-10-CM | POA: Diagnosis not present

## 2019-02-23 DIAGNOSIS — J449 Chronic obstructive pulmonary disease, unspecified: Secondary | ICD-10-CM | POA: Diagnosis not present

## 2019-02-23 DIAGNOSIS — J9611 Chronic respiratory failure with hypoxia: Secondary | ICD-10-CM | POA: Diagnosis not present

## 2019-02-23 DIAGNOSIS — I10 Essential (primary) hypertension: Secondary | ICD-10-CM | POA: Diagnosis not present

## 2019-02-23 DIAGNOSIS — I739 Peripheral vascular disease, unspecified: Secondary | ICD-10-CM | POA: Diagnosis not present

## 2019-02-23 DIAGNOSIS — R69 Illness, unspecified: Secondary | ICD-10-CM | POA: Diagnosis not present

## 2019-02-23 DIAGNOSIS — R7303 Prediabetes: Secondary | ICD-10-CM | POA: Diagnosis not present

## 2019-02-23 DIAGNOSIS — T8789 Other complications of amputation stump: Secondary | ICD-10-CM | POA: Diagnosis not present

## 2019-02-23 DIAGNOSIS — G40901 Epilepsy, unspecified, not intractable, with status epilepticus: Secondary | ICD-10-CM | POA: Diagnosis not present

## 2019-02-24 DIAGNOSIS — J449 Chronic obstructive pulmonary disease, unspecified: Secondary | ICD-10-CM | POA: Diagnosis not present

## 2019-02-24 DIAGNOSIS — I10 Essential (primary) hypertension: Secondary | ICD-10-CM | POA: Diagnosis not present

## 2019-02-24 DIAGNOSIS — R69 Illness, unspecified: Secondary | ICD-10-CM | POA: Diagnosis not present

## 2019-02-24 DIAGNOSIS — T8789 Other complications of amputation stump: Secondary | ICD-10-CM | POA: Diagnosis not present

## 2019-02-24 DIAGNOSIS — G40901 Epilepsy, unspecified, not intractable, with status epilepticus: Secondary | ICD-10-CM | POA: Diagnosis not present

## 2019-02-24 DIAGNOSIS — I739 Peripheral vascular disease, unspecified: Secondary | ICD-10-CM | POA: Diagnosis not present

## 2019-02-24 DIAGNOSIS — J9611 Chronic respiratory failure with hypoxia: Secondary | ICD-10-CM | POA: Diagnosis not present

## 2019-02-24 DIAGNOSIS — R7303 Prediabetes: Secondary | ICD-10-CM | POA: Diagnosis not present

## 2019-02-25 DIAGNOSIS — J441 Chronic obstructive pulmonary disease with (acute) exacerbation: Secondary | ICD-10-CM | POA: Diagnosis not present

## 2019-02-25 DIAGNOSIS — R41841 Cognitive communication deficit: Secondary | ICD-10-CM | POA: Diagnosis not present

## 2019-02-25 DIAGNOSIS — I10 Essential (primary) hypertension: Secondary | ICD-10-CM | POA: Diagnosis not present

## 2019-02-25 DIAGNOSIS — M6281 Muscle weakness (generalized): Secondary | ICD-10-CM | POA: Diagnosis not present

## 2019-02-27 DIAGNOSIS — J9611 Chronic respiratory failure with hypoxia: Secondary | ICD-10-CM | POA: Diagnosis not present

## 2019-02-27 DIAGNOSIS — R7303 Prediabetes: Secondary | ICD-10-CM | POA: Diagnosis not present

## 2019-02-27 DIAGNOSIS — T8789 Other complications of amputation stump: Secondary | ICD-10-CM | POA: Diagnosis not present

## 2019-02-27 DIAGNOSIS — R69 Illness, unspecified: Secondary | ICD-10-CM | POA: Diagnosis not present

## 2019-02-27 DIAGNOSIS — G40901 Epilepsy, unspecified, not intractable, with status epilepticus: Secondary | ICD-10-CM | POA: Diagnosis not present

## 2019-02-27 DIAGNOSIS — J449 Chronic obstructive pulmonary disease, unspecified: Secondary | ICD-10-CM | POA: Diagnosis not present

## 2019-02-27 DIAGNOSIS — I10 Essential (primary) hypertension: Secondary | ICD-10-CM | POA: Diagnosis not present

## 2019-02-27 DIAGNOSIS — I739 Peripheral vascular disease, unspecified: Secondary | ICD-10-CM | POA: Diagnosis not present

## 2019-02-28 DIAGNOSIS — I10 Essential (primary) hypertension: Secondary | ICD-10-CM | POA: Diagnosis not present

## 2019-02-28 DIAGNOSIS — G40901 Epilepsy, unspecified, not intractable, with status epilepticus: Secondary | ICD-10-CM | POA: Diagnosis not present

## 2019-02-28 DIAGNOSIS — R69 Illness, unspecified: Secondary | ICD-10-CM | POA: Diagnosis not present

## 2019-02-28 DIAGNOSIS — J449 Chronic obstructive pulmonary disease, unspecified: Secondary | ICD-10-CM | POA: Diagnosis not present

## 2019-02-28 DIAGNOSIS — R7303 Prediabetes: Secondary | ICD-10-CM | POA: Diagnosis not present

## 2019-02-28 DIAGNOSIS — J9611 Chronic respiratory failure with hypoxia: Secondary | ICD-10-CM | POA: Diagnosis not present

## 2019-02-28 DIAGNOSIS — T8789 Other complications of amputation stump: Secondary | ICD-10-CM | POA: Diagnosis not present

## 2019-02-28 DIAGNOSIS — I739 Peripheral vascular disease, unspecified: Secondary | ICD-10-CM | POA: Diagnosis not present

## 2019-03-01 DIAGNOSIS — I10 Essential (primary) hypertension: Secondary | ICD-10-CM | POA: Diagnosis not present

## 2019-03-01 DIAGNOSIS — I739 Peripheral vascular disease, unspecified: Secondary | ICD-10-CM | POA: Diagnosis not present

## 2019-03-01 DIAGNOSIS — T8789 Other complications of amputation stump: Secondary | ICD-10-CM | POA: Diagnosis not present

## 2019-03-01 DIAGNOSIS — R7303 Prediabetes: Secondary | ICD-10-CM | POA: Diagnosis not present

## 2019-03-01 DIAGNOSIS — J449 Chronic obstructive pulmonary disease, unspecified: Secondary | ICD-10-CM | POA: Diagnosis not present

## 2019-03-01 DIAGNOSIS — R69 Illness, unspecified: Secondary | ICD-10-CM | POA: Diagnosis not present

## 2019-03-01 DIAGNOSIS — J9611 Chronic respiratory failure with hypoxia: Secondary | ICD-10-CM | POA: Diagnosis not present

## 2019-03-01 DIAGNOSIS — G40901 Epilepsy, unspecified, not intractable, with status epilepticus: Secondary | ICD-10-CM | POA: Diagnosis not present

## 2019-03-01 DIAGNOSIS — R0902 Hypoxemia: Secondary | ICD-10-CM | POA: Diagnosis not present

## 2019-03-02 DIAGNOSIS — J9611 Chronic respiratory failure with hypoxia: Secondary | ICD-10-CM | POA: Diagnosis not present

## 2019-03-02 DIAGNOSIS — R7303 Prediabetes: Secondary | ICD-10-CM | POA: Diagnosis not present

## 2019-03-02 DIAGNOSIS — J449 Chronic obstructive pulmonary disease, unspecified: Secondary | ICD-10-CM | POA: Diagnosis not present

## 2019-03-02 DIAGNOSIS — G40901 Epilepsy, unspecified, not intractable, with status epilepticus: Secondary | ICD-10-CM | POA: Diagnosis not present

## 2019-03-02 DIAGNOSIS — R69 Illness, unspecified: Secondary | ICD-10-CM | POA: Diagnosis not present

## 2019-03-02 DIAGNOSIS — T8789 Other complications of amputation stump: Secondary | ICD-10-CM | POA: Diagnosis not present

## 2019-03-02 DIAGNOSIS — I739 Peripheral vascular disease, unspecified: Secondary | ICD-10-CM | POA: Diagnosis not present

## 2019-03-02 DIAGNOSIS — I10 Essential (primary) hypertension: Secondary | ICD-10-CM | POA: Diagnosis not present

## 2019-03-06 DIAGNOSIS — R7303 Prediabetes: Secondary | ICD-10-CM | POA: Diagnosis not present

## 2019-03-06 DIAGNOSIS — J9611 Chronic respiratory failure with hypoxia: Secondary | ICD-10-CM | POA: Diagnosis not present

## 2019-03-06 DIAGNOSIS — I10 Essential (primary) hypertension: Secondary | ICD-10-CM | POA: Diagnosis not present

## 2019-03-06 DIAGNOSIS — R69 Illness, unspecified: Secondary | ICD-10-CM | POA: Diagnosis not present

## 2019-03-06 DIAGNOSIS — G40901 Epilepsy, unspecified, not intractable, with status epilepticus: Secondary | ICD-10-CM | POA: Diagnosis not present

## 2019-03-06 DIAGNOSIS — J449 Chronic obstructive pulmonary disease, unspecified: Secondary | ICD-10-CM | POA: Diagnosis not present

## 2019-03-06 DIAGNOSIS — T8789 Other complications of amputation stump: Secondary | ICD-10-CM | POA: Diagnosis not present

## 2019-03-06 DIAGNOSIS — I739 Peripheral vascular disease, unspecified: Secondary | ICD-10-CM | POA: Diagnosis not present

## 2019-03-07 DIAGNOSIS — J9611 Chronic respiratory failure with hypoxia: Secondary | ICD-10-CM | POA: Diagnosis not present

## 2019-03-07 DIAGNOSIS — R69 Illness, unspecified: Secondary | ICD-10-CM | POA: Diagnosis not present

## 2019-03-07 DIAGNOSIS — J449 Chronic obstructive pulmonary disease, unspecified: Secondary | ICD-10-CM | POA: Diagnosis not present

## 2019-03-07 DIAGNOSIS — T8789 Other complications of amputation stump: Secondary | ICD-10-CM | POA: Diagnosis not present

## 2019-03-07 DIAGNOSIS — I739 Peripheral vascular disease, unspecified: Secondary | ICD-10-CM | POA: Diagnosis not present

## 2019-03-07 DIAGNOSIS — G40901 Epilepsy, unspecified, not intractable, with status epilepticus: Secondary | ICD-10-CM | POA: Diagnosis not present

## 2019-03-07 DIAGNOSIS — I10 Essential (primary) hypertension: Secondary | ICD-10-CM | POA: Diagnosis not present

## 2019-03-07 DIAGNOSIS — R7303 Prediabetes: Secondary | ICD-10-CM | POA: Diagnosis not present

## 2019-03-08 DIAGNOSIS — R69 Illness, unspecified: Secondary | ICD-10-CM | POA: Diagnosis not present

## 2019-03-08 DIAGNOSIS — J449 Chronic obstructive pulmonary disease, unspecified: Secondary | ICD-10-CM | POA: Diagnosis not present

## 2019-03-08 DIAGNOSIS — I10 Essential (primary) hypertension: Secondary | ICD-10-CM | POA: Diagnosis not present

## 2019-03-08 DIAGNOSIS — T8789 Other complications of amputation stump: Secondary | ICD-10-CM | POA: Diagnosis not present

## 2019-03-08 DIAGNOSIS — I739 Peripheral vascular disease, unspecified: Secondary | ICD-10-CM | POA: Diagnosis not present

## 2019-03-08 DIAGNOSIS — G40901 Epilepsy, unspecified, not intractable, with status epilepticus: Secondary | ICD-10-CM | POA: Diagnosis not present

## 2019-03-08 DIAGNOSIS — R7303 Prediabetes: Secondary | ICD-10-CM | POA: Diagnosis not present

## 2019-03-08 DIAGNOSIS — J9611 Chronic respiratory failure with hypoxia: Secondary | ICD-10-CM | POA: Diagnosis not present

## 2019-03-09 DIAGNOSIS — I714 Abdominal aortic aneurysm, without rupture: Secondary | ICD-10-CM | POA: Diagnosis not present

## 2019-03-10 DIAGNOSIS — G40901 Epilepsy, unspecified, not intractable, with status epilepticus: Secondary | ICD-10-CM | POA: Diagnosis not present

## 2019-03-10 DIAGNOSIS — I10 Essential (primary) hypertension: Secondary | ICD-10-CM | POA: Diagnosis not present

## 2019-03-10 DIAGNOSIS — R69 Illness, unspecified: Secondary | ICD-10-CM | POA: Diagnosis not present

## 2019-03-10 DIAGNOSIS — J9611 Chronic respiratory failure with hypoxia: Secondary | ICD-10-CM | POA: Diagnosis not present

## 2019-03-10 DIAGNOSIS — R7303 Prediabetes: Secondary | ICD-10-CM | POA: Diagnosis not present

## 2019-03-10 DIAGNOSIS — I739 Peripheral vascular disease, unspecified: Secondary | ICD-10-CM | POA: Diagnosis not present

## 2019-03-10 DIAGNOSIS — J449 Chronic obstructive pulmonary disease, unspecified: Secondary | ICD-10-CM | POA: Diagnosis not present

## 2019-03-10 DIAGNOSIS — T8789 Other complications of amputation stump: Secondary | ICD-10-CM | POA: Diagnosis not present

## 2019-03-13 DIAGNOSIS — R69 Illness, unspecified: Secondary | ICD-10-CM | POA: Diagnosis not present

## 2019-03-14 DIAGNOSIS — R69 Illness, unspecified: Secondary | ICD-10-CM | POA: Diagnosis not present

## 2019-03-15 DIAGNOSIS — Z8631 Personal history of diabetic foot ulcer: Secondary | ICD-10-CM | POA: Diagnosis not present

## 2019-03-16 DIAGNOSIS — T8789 Other complications of amputation stump: Secondary | ICD-10-CM | POA: Diagnosis not present

## 2019-03-16 DIAGNOSIS — I10 Essential (primary) hypertension: Secondary | ICD-10-CM | POA: Diagnosis not present

## 2019-03-16 DIAGNOSIS — J449 Chronic obstructive pulmonary disease, unspecified: Secondary | ICD-10-CM | POA: Diagnosis not present

## 2019-03-16 DIAGNOSIS — I739 Peripheral vascular disease, unspecified: Secondary | ICD-10-CM | POA: Diagnosis not present

## 2019-03-16 DIAGNOSIS — R69 Illness, unspecified: Secondary | ICD-10-CM | POA: Diagnosis not present

## 2019-03-16 DIAGNOSIS — J9611 Chronic respiratory failure with hypoxia: Secondary | ICD-10-CM | POA: Diagnosis not present

## 2019-03-16 DIAGNOSIS — R7303 Prediabetes: Secondary | ICD-10-CM | POA: Diagnosis not present

## 2019-03-16 DIAGNOSIS — G40901 Epilepsy, unspecified, not intractable, with status epilepticus: Secondary | ICD-10-CM | POA: Diagnosis not present

## 2019-03-20 DIAGNOSIS — I739 Peripheral vascular disease, unspecified: Secondary | ICD-10-CM | POA: Diagnosis not present

## 2019-03-20 DIAGNOSIS — J9611 Chronic respiratory failure with hypoxia: Secondary | ICD-10-CM | POA: Diagnosis not present

## 2019-03-20 DIAGNOSIS — R69 Illness, unspecified: Secondary | ICD-10-CM | POA: Diagnosis not present

## 2019-03-20 DIAGNOSIS — I10 Essential (primary) hypertension: Secondary | ICD-10-CM | POA: Diagnosis not present

## 2019-03-20 DIAGNOSIS — R7303 Prediabetes: Secondary | ICD-10-CM | POA: Diagnosis not present

## 2019-03-20 DIAGNOSIS — G40901 Epilepsy, unspecified, not intractable, with status epilepticus: Secondary | ICD-10-CM | POA: Diagnosis not present

## 2019-03-20 DIAGNOSIS — J449 Chronic obstructive pulmonary disease, unspecified: Secondary | ICD-10-CM | POA: Diagnosis not present

## 2019-03-20 DIAGNOSIS — T8789 Other complications of amputation stump: Secondary | ICD-10-CM | POA: Diagnosis not present

## 2019-03-23 DIAGNOSIS — R7303 Prediabetes: Secondary | ICD-10-CM | POA: Diagnosis not present

## 2019-03-23 DIAGNOSIS — I739 Peripheral vascular disease, unspecified: Secondary | ICD-10-CM | POA: Diagnosis not present

## 2019-03-23 DIAGNOSIS — J449 Chronic obstructive pulmonary disease, unspecified: Secondary | ICD-10-CM | POA: Diagnosis not present

## 2019-03-23 DIAGNOSIS — G40901 Epilepsy, unspecified, not intractable, with status epilepticus: Secondary | ICD-10-CM | POA: Diagnosis not present

## 2019-03-23 DIAGNOSIS — R69 Illness, unspecified: Secondary | ICD-10-CM | POA: Diagnosis not present

## 2019-03-23 DIAGNOSIS — J9611 Chronic respiratory failure with hypoxia: Secondary | ICD-10-CM | POA: Diagnosis not present

## 2019-03-23 DIAGNOSIS — T8789 Other complications of amputation stump: Secondary | ICD-10-CM | POA: Diagnosis not present

## 2019-03-23 DIAGNOSIS — I10 Essential (primary) hypertension: Secondary | ICD-10-CM | POA: Diagnosis not present

## 2019-03-24 DIAGNOSIS — I10 Essential (primary) hypertension: Secondary | ICD-10-CM | POA: Diagnosis not present

## 2019-03-24 DIAGNOSIS — I739 Peripheral vascular disease, unspecified: Secondary | ICD-10-CM | POA: Diagnosis not present

## 2019-03-24 DIAGNOSIS — G40901 Epilepsy, unspecified, not intractable, with status epilepticus: Secondary | ICD-10-CM | POA: Diagnosis not present

## 2019-03-24 DIAGNOSIS — T8789 Other complications of amputation stump: Secondary | ICD-10-CM | POA: Diagnosis not present

## 2019-03-24 DIAGNOSIS — J449 Chronic obstructive pulmonary disease, unspecified: Secondary | ICD-10-CM | POA: Diagnosis not present

## 2019-03-24 DIAGNOSIS — R69 Illness, unspecified: Secondary | ICD-10-CM | POA: Diagnosis not present

## 2019-03-24 DIAGNOSIS — J9611 Chronic respiratory failure with hypoxia: Secondary | ICD-10-CM | POA: Diagnosis not present

## 2019-03-24 DIAGNOSIS — R7303 Prediabetes: Secondary | ICD-10-CM | POA: Diagnosis not present

## 2019-03-27 DIAGNOSIS — G40901 Epilepsy, unspecified, not intractable, with status epilepticus: Secondary | ICD-10-CM | POA: Diagnosis not present

## 2019-03-27 DIAGNOSIS — J9611 Chronic respiratory failure with hypoxia: Secondary | ICD-10-CM | POA: Diagnosis not present

## 2019-03-27 DIAGNOSIS — T8789 Other complications of amputation stump: Secondary | ICD-10-CM | POA: Diagnosis not present

## 2019-03-27 DIAGNOSIS — R69 Illness, unspecified: Secondary | ICD-10-CM | POA: Diagnosis not present

## 2019-03-27 DIAGNOSIS — J449 Chronic obstructive pulmonary disease, unspecified: Secondary | ICD-10-CM | POA: Diagnosis not present

## 2019-03-27 DIAGNOSIS — I739 Peripheral vascular disease, unspecified: Secondary | ICD-10-CM | POA: Diagnosis not present

## 2019-03-27 DIAGNOSIS — I10 Essential (primary) hypertension: Secondary | ICD-10-CM | POA: Diagnosis not present

## 2019-03-27 DIAGNOSIS — R7303 Prediabetes: Secondary | ICD-10-CM | POA: Diagnosis not present

## 2019-03-28 DIAGNOSIS — I10 Essential (primary) hypertension: Secondary | ICD-10-CM | POA: Diagnosis not present

## 2019-03-28 DIAGNOSIS — T8789 Other complications of amputation stump: Secondary | ICD-10-CM | POA: Diagnosis not present

## 2019-03-28 DIAGNOSIS — J449 Chronic obstructive pulmonary disease, unspecified: Secondary | ICD-10-CM | POA: Diagnosis not present

## 2019-03-28 DIAGNOSIS — G40901 Epilepsy, unspecified, not intractable, with status epilepticus: Secondary | ICD-10-CM | POA: Diagnosis not present

## 2019-03-28 DIAGNOSIS — I739 Peripheral vascular disease, unspecified: Secondary | ICD-10-CM | POA: Diagnosis not present

## 2019-03-28 DIAGNOSIS — M6281 Muscle weakness (generalized): Secondary | ICD-10-CM | POA: Diagnosis not present

## 2019-03-28 DIAGNOSIS — J441 Chronic obstructive pulmonary disease with (acute) exacerbation: Secondary | ICD-10-CM | POA: Diagnosis not present

## 2019-03-28 DIAGNOSIS — R69 Illness, unspecified: Secondary | ICD-10-CM | POA: Diagnosis not present

## 2019-03-28 DIAGNOSIS — R7303 Prediabetes: Secondary | ICD-10-CM | POA: Diagnosis not present

## 2019-03-28 DIAGNOSIS — R41841 Cognitive communication deficit: Secondary | ICD-10-CM | POA: Diagnosis not present

## 2019-03-28 DIAGNOSIS — J9611 Chronic respiratory failure with hypoxia: Secondary | ICD-10-CM | POA: Diagnosis not present

## 2019-03-29 DIAGNOSIS — I10 Essential (primary) hypertension: Secondary | ICD-10-CM | POA: Diagnosis not present

## 2019-03-29 DIAGNOSIS — T8789 Other complications of amputation stump: Secondary | ICD-10-CM | POA: Diagnosis not present

## 2019-03-29 DIAGNOSIS — J9611 Chronic respiratory failure with hypoxia: Secondary | ICD-10-CM | POA: Diagnosis not present

## 2019-03-29 DIAGNOSIS — R69 Illness, unspecified: Secondary | ICD-10-CM | POA: Diagnosis not present

## 2019-03-29 DIAGNOSIS — R7303 Prediabetes: Secondary | ICD-10-CM | POA: Diagnosis not present

## 2019-03-29 DIAGNOSIS — I739 Peripheral vascular disease, unspecified: Secondary | ICD-10-CM | POA: Diagnosis not present

## 2019-03-29 DIAGNOSIS — G40901 Epilepsy, unspecified, not intractable, with status epilepticus: Secondary | ICD-10-CM | POA: Diagnosis not present

## 2019-03-29 DIAGNOSIS — J449 Chronic obstructive pulmonary disease, unspecified: Secondary | ICD-10-CM | POA: Diagnosis not present

## 2019-04-01 DIAGNOSIS — R0902 Hypoxemia: Secondary | ICD-10-CM | POA: Diagnosis not present

## 2019-04-03 DIAGNOSIS — J9611 Chronic respiratory failure with hypoxia: Secondary | ICD-10-CM | POA: Diagnosis not present

## 2019-04-03 DIAGNOSIS — G40901 Epilepsy, unspecified, not intractable, with status epilepticus: Secondary | ICD-10-CM | POA: Diagnosis not present

## 2019-04-03 DIAGNOSIS — J449 Chronic obstructive pulmonary disease, unspecified: Secondary | ICD-10-CM | POA: Diagnosis not present

## 2019-04-03 DIAGNOSIS — R69 Illness, unspecified: Secondary | ICD-10-CM | POA: Diagnosis not present

## 2019-04-03 DIAGNOSIS — R7303 Prediabetes: Secondary | ICD-10-CM | POA: Diagnosis not present

## 2019-04-03 DIAGNOSIS — I10 Essential (primary) hypertension: Secondary | ICD-10-CM | POA: Diagnosis not present

## 2019-04-03 DIAGNOSIS — I739 Peripheral vascular disease, unspecified: Secondary | ICD-10-CM | POA: Diagnosis not present

## 2019-04-03 DIAGNOSIS — T8789 Other complications of amputation stump: Secondary | ICD-10-CM | POA: Diagnosis not present

## 2019-04-04 DIAGNOSIS — J9611 Chronic respiratory failure with hypoxia: Secondary | ICD-10-CM | POA: Diagnosis not present

## 2019-04-04 DIAGNOSIS — R7303 Prediabetes: Secondary | ICD-10-CM | POA: Diagnosis not present

## 2019-04-04 DIAGNOSIS — G40901 Epilepsy, unspecified, not intractable, with status epilepticus: Secondary | ICD-10-CM | POA: Diagnosis not present

## 2019-04-04 DIAGNOSIS — R69 Illness, unspecified: Secondary | ICD-10-CM | POA: Diagnosis not present

## 2019-04-04 DIAGNOSIS — T8789 Other complications of amputation stump: Secondary | ICD-10-CM | POA: Diagnosis not present

## 2019-04-04 DIAGNOSIS — I10 Essential (primary) hypertension: Secondary | ICD-10-CM | POA: Diagnosis not present

## 2019-04-04 DIAGNOSIS — I739 Peripheral vascular disease, unspecified: Secondary | ICD-10-CM | POA: Diagnosis not present

## 2019-04-04 DIAGNOSIS — J449 Chronic obstructive pulmonary disease, unspecified: Secondary | ICD-10-CM | POA: Diagnosis not present

## 2019-04-05 DIAGNOSIS — J9611 Chronic respiratory failure with hypoxia: Secondary | ICD-10-CM | POA: Diagnosis not present

## 2019-04-05 DIAGNOSIS — I10 Essential (primary) hypertension: Secondary | ICD-10-CM | POA: Diagnosis not present

## 2019-04-05 DIAGNOSIS — T8789 Other complications of amputation stump: Secondary | ICD-10-CM | POA: Diagnosis not present

## 2019-04-05 DIAGNOSIS — I739 Peripheral vascular disease, unspecified: Secondary | ICD-10-CM | POA: Diagnosis not present

## 2019-04-05 DIAGNOSIS — R7303 Prediabetes: Secondary | ICD-10-CM | POA: Diagnosis not present

## 2019-04-05 DIAGNOSIS — G40901 Epilepsy, unspecified, not intractable, with status epilepticus: Secondary | ICD-10-CM | POA: Diagnosis not present

## 2019-04-05 DIAGNOSIS — R69 Illness, unspecified: Secondary | ICD-10-CM | POA: Diagnosis not present

## 2019-04-05 DIAGNOSIS — J449 Chronic obstructive pulmonary disease, unspecified: Secondary | ICD-10-CM | POA: Diagnosis not present

## 2019-04-06 DIAGNOSIS — R69 Illness, unspecified: Secondary | ICD-10-CM | POA: Diagnosis not present

## 2019-04-11 DIAGNOSIS — G40901 Epilepsy, unspecified, not intractable, with status epilepticus: Secondary | ICD-10-CM | POA: Diagnosis not present

## 2019-04-11 DIAGNOSIS — I10 Essential (primary) hypertension: Secondary | ICD-10-CM | POA: Diagnosis not present

## 2019-04-11 DIAGNOSIS — R7303 Prediabetes: Secondary | ICD-10-CM | POA: Diagnosis not present

## 2019-04-11 DIAGNOSIS — T8789 Other complications of amputation stump: Secondary | ICD-10-CM | POA: Diagnosis not present

## 2019-04-11 DIAGNOSIS — I739 Peripheral vascular disease, unspecified: Secondary | ICD-10-CM | POA: Diagnosis not present

## 2019-04-11 DIAGNOSIS — R69 Illness, unspecified: Secondary | ICD-10-CM | POA: Diagnosis not present

## 2019-04-11 DIAGNOSIS — J449 Chronic obstructive pulmonary disease, unspecified: Secondary | ICD-10-CM | POA: Diagnosis not present

## 2019-04-11 DIAGNOSIS — J9611 Chronic respiratory failure with hypoxia: Secondary | ICD-10-CM | POA: Diagnosis not present

## 2019-04-12 DIAGNOSIS — T8789 Other complications of amputation stump: Secondary | ICD-10-CM | POA: Diagnosis not present

## 2019-04-12 DIAGNOSIS — G40901 Epilepsy, unspecified, not intractable, with status epilepticus: Secondary | ICD-10-CM | POA: Diagnosis not present

## 2019-04-12 DIAGNOSIS — I10 Essential (primary) hypertension: Secondary | ICD-10-CM | POA: Diagnosis not present

## 2019-04-12 DIAGNOSIS — J449 Chronic obstructive pulmonary disease, unspecified: Secondary | ICD-10-CM | POA: Diagnosis not present

## 2019-04-12 DIAGNOSIS — J9611 Chronic respiratory failure with hypoxia: Secondary | ICD-10-CM | POA: Diagnosis not present

## 2019-04-12 DIAGNOSIS — R69 Illness, unspecified: Secondary | ICD-10-CM | POA: Diagnosis not present

## 2019-04-12 DIAGNOSIS — R7303 Prediabetes: Secondary | ICD-10-CM | POA: Diagnosis not present

## 2019-04-12 DIAGNOSIS — I739 Peripheral vascular disease, unspecified: Secondary | ICD-10-CM | POA: Diagnosis not present

## 2019-04-13 DIAGNOSIS — J449 Chronic obstructive pulmonary disease, unspecified: Secondary | ICD-10-CM | POA: Diagnosis not present

## 2019-04-13 DIAGNOSIS — R7303 Prediabetes: Secondary | ICD-10-CM | POA: Diagnosis not present

## 2019-04-13 DIAGNOSIS — T8789 Other complications of amputation stump: Secondary | ICD-10-CM | POA: Diagnosis not present

## 2019-04-13 DIAGNOSIS — I739 Peripheral vascular disease, unspecified: Secondary | ICD-10-CM | POA: Diagnosis not present

## 2019-04-13 DIAGNOSIS — Z09 Encounter for follow-up examination after completed treatment for conditions other than malignant neoplasm: Secondary | ICD-10-CM | POA: Diagnosis not present

## 2019-04-13 DIAGNOSIS — R69 Illness, unspecified: Secondary | ICD-10-CM | POA: Diagnosis not present

## 2019-04-19 DIAGNOSIS — Z95828 Presence of other vascular implants and grafts: Secondary | ICD-10-CM | POA: Diagnosis not present

## 2019-04-19 DIAGNOSIS — S98132D Complete traumatic amputation of one left lesser toe, subsequent encounter: Secondary | ICD-10-CM | POA: Diagnosis not present

## 2019-04-19 DIAGNOSIS — I739 Peripheral vascular disease, unspecified: Secondary | ICD-10-CM | POA: Diagnosis not present

## 2019-04-19 DIAGNOSIS — I6523 Occlusion and stenosis of bilateral carotid arteries: Secondary | ICD-10-CM | POA: Diagnosis not present

## 2019-04-19 DIAGNOSIS — I6529 Occlusion and stenosis of unspecified carotid artery: Secondary | ICD-10-CM | POA: Diagnosis not present

## 2019-04-20 DIAGNOSIS — I739 Peripheral vascular disease, unspecified: Secondary | ICD-10-CM | POA: Diagnosis not present

## 2019-04-20 DIAGNOSIS — G40901 Epilepsy, unspecified, not intractable, with status epilepticus: Secondary | ICD-10-CM | POA: Diagnosis not present

## 2019-04-20 DIAGNOSIS — J9611 Chronic respiratory failure with hypoxia: Secondary | ICD-10-CM | POA: Diagnosis not present

## 2019-04-20 DIAGNOSIS — J449 Chronic obstructive pulmonary disease, unspecified: Secondary | ICD-10-CM | POA: Diagnosis not present

## 2019-04-20 DIAGNOSIS — T8789 Other complications of amputation stump: Secondary | ICD-10-CM | POA: Diagnosis not present

## 2019-04-20 DIAGNOSIS — R69 Illness, unspecified: Secondary | ICD-10-CM | POA: Diagnosis not present

## 2019-04-20 DIAGNOSIS — R7303 Prediabetes: Secondary | ICD-10-CM | POA: Diagnosis not present

## 2019-04-20 DIAGNOSIS — I10 Essential (primary) hypertension: Secondary | ICD-10-CM | POA: Diagnosis not present

## 2019-04-21 DIAGNOSIS — R69 Illness, unspecified: Secondary | ICD-10-CM | POA: Diagnosis not present

## 2019-04-26 DIAGNOSIS — G40901 Epilepsy, unspecified, not intractable, with status epilepticus: Secondary | ICD-10-CM | POA: Diagnosis not present

## 2019-04-26 DIAGNOSIS — J449 Chronic obstructive pulmonary disease, unspecified: Secondary | ICD-10-CM | POA: Diagnosis not present

## 2019-04-26 DIAGNOSIS — T8789 Other complications of amputation stump: Secondary | ICD-10-CM | POA: Diagnosis not present

## 2019-04-26 DIAGNOSIS — R69 Illness, unspecified: Secondary | ICD-10-CM | POA: Diagnosis not present

## 2019-04-26 DIAGNOSIS — R7303 Prediabetes: Secondary | ICD-10-CM | POA: Diagnosis not present

## 2019-04-26 DIAGNOSIS — I739 Peripheral vascular disease, unspecified: Secondary | ICD-10-CM | POA: Diagnosis not present

## 2019-04-26 DIAGNOSIS — I10 Essential (primary) hypertension: Secondary | ICD-10-CM | POA: Diagnosis not present

## 2019-04-26 DIAGNOSIS — J9611 Chronic respiratory failure with hypoxia: Secondary | ICD-10-CM | POA: Diagnosis not present

## 2019-04-27 DIAGNOSIS — I10 Essential (primary) hypertension: Secondary | ICD-10-CM | POA: Diagnosis not present

## 2019-04-27 DIAGNOSIS — R7303 Prediabetes: Secondary | ICD-10-CM | POA: Diagnosis not present

## 2019-04-27 DIAGNOSIS — J449 Chronic obstructive pulmonary disease, unspecified: Secondary | ICD-10-CM | POA: Diagnosis not present

## 2019-04-27 DIAGNOSIS — J9611 Chronic respiratory failure with hypoxia: Secondary | ICD-10-CM | POA: Diagnosis not present

## 2019-04-27 DIAGNOSIS — M6281 Muscle weakness (generalized): Secondary | ICD-10-CM | POA: Diagnosis not present

## 2019-04-27 DIAGNOSIS — J441 Chronic obstructive pulmonary disease with (acute) exacerbation: Secondary | ICD-10-CM | POA: Diagnosis not present

## 2019-04-27 DIAGNOSIS — R69 Illness, unspecified: Secondary | ICD-10-CM | POA: Diagnosis not present

## 2019-04-27 DIAGNOSIS — G40901 Epilepsy, unspecified, not intractable, with status epilepticus: Secondary | ICD-10-CM | POA: Diagnosis not present

## 2019-04-27 DIAGNOSIS — I739 Peripheral vascular disease, unspecified: Secondary | ICD-10-CM | POA: Diagnosis not present

## 2019-04-27 DIAGNOSIS — T8789 Other complications of amputation stump: Secondary | ICD-10-CM | POA: Diagnosis not present

## 2019-04-27 DIAGNOSIS — R41841 Cognitive communication deficit: Secondary | ICD-10-CM | POA: Diagnosis not present

## 2019-04-28 DIAGNOSIS — I10 Essential (primary) hypertension: Secondary | ICD-10-CM | POA: Diagnosis not present

## 2019-04-28 DIAGNOSIS — G40901 Epilepsy, unspecified, not intractable, with status epilepticus: Secondary | ICD-10-CM | POA: Diagnosis not present

## 2019-04-28 DIAGNOSIS — J9611 Chronic respiratory failure with hypoxia: Secondary | ICD-10-CM | POA: Diagnosis not present

## 2019-04-28 DIAGNOSIS — I739 Peripheral vascular disease, unspecified: Secondary | ICD-10-CM | POA: Diagnosis not present

## 2019-04-28 DIAGNOSIS — R7303 Prediabetes: Secondary | ICD-10-CM | POA: Diagnosis not present

## 2019-04-28 DIAGNOSIS — J449 Chronic obstructive pulmonary disease, unspecified: Secondary | ICD-10-CM | POA: Diagnosis not present

## 2019-04-28 DIAGNOSIS — T8789 Other complications of amputation stump: Secondary | ICD-10-CM | POA: Diagnosis not present

## 2019-04-28 DIAGNOSIS — R69 Illness, unspecified: Secondary | ICD-10-CM | POA: Diagnosis not present

## 2019-05-01 DIAGNOSIS — R0902 Hypoxemia: Secondary | ICD-10-CM | POA: Diagnosis not present

## 2019-05-03 DIAGNOSIS — J9611 Chronic respiratory failure with hypoxia: Secondary | ICD-10-CM | POA: Diagnosis not present

## 2019-05-03 DIAGNOSIS — I10 Essential (primary) hypertension: Secondary | ICD-10-CM | POA: Diagnosis not present

## 2019-05-03 DIAGNOSIS — R69 Illness, unspecified: Secondary | ICD-10-CM | POA: Diagnosis not present

## 2019-05-03 DIAGNOSIS — I739 Peripheral vascular disease, unspecified: Secondary | ICD-10-CM | POA: Diagnosis not present

## 2019-05-03 DIAGNOSIS — G40901 Epilepsy, unspecified, not intractable, with status epilepticus: Secondary | ICD-10-CM | POA: Diagnosis not present

## 2019-05-03 DIAGNOSIS — T8789 Other complications of amputation stump: Secondary | ICD-10-CM | POA: Diagnosis not present

## 2019-05-03 DIAGNOSIS — R7303 Prediabetes: Secondary | ICD-10-CM | POA: Diagnosis not present

## 2019-05-03 DIAGNOSIS — J449 Chronic obstructive pulmonary disease, unspecified: Secondary | ICD-10-CM | POA: Diagnosis not present

## 2019-05-04 DIAGNOSIS — J9611 Chronic respiratory failure with hypoxia: Secondary | ICD-10-CM | POA: Diagnosis not present

## 2019-05-04 DIAGNOSIS — T8789 Other complications of amputation stump: Secondary | ICD-10-CM | POA: Diagnosis not present

## 2019-05-04 DIAGNOSIS — I739 Peripheral vascular disease, unspecified: Secondary | ICD-10-CM | POA: Diagnosis not present

## 2019-05-04 DIAGNOSIS — R69 Illness, unspecified: Secondary | ICD-10-CM | POA: Diagnosis not present

## 2019-05-04 DIAGNOSIS — J449 Chronic obstructive pulmonary disease, unspecified: Secondary | ICD-10-CM | POA: Diagnosis not present

## 2019-05-04 DIAGNOSIS — G40901 Epilepsy, unspecified, not intractable, with status epilepticus: Secondary | ICD-10-CM | POA: Diagnosis not present

## 2019-05-04 DIAGNOSIS — R7303 Prediabetes: Secondary | ICD-10-CM | POA: Diagnosis not present

## 2019-05-04 DIAGNOSIS — I10 Essential (primary) hypertension: Secondary | ICD-10-CM | POA: Diagnosis not present

## 2019-05-09 DIAGNOSIS — I10 Essential (primary) hypertension: Secondary | ICD-10-CM | POA: Diagnosis not present

## 2019-05-09 DIAGNOSIS — J449 Chronic obstructive pulmonary disease, unspecified: Secondary | ICD-10-CM | POA: Diagnosis not present

## 2019-05-09 DIAGNOSIS — T8789 Other complications of amputation stump: Secondary | ICD-10-CM | POA: Diagnosis not present

## 2019-05-09 DIAGNOSIS — I739 Peripheral vascular disease, unspecified: Secondary | ICD-10-CM | POA: Diagnosis not present

## 2019-05-09 DIAGNOSIS — R7303 Prediabetes: Secondary | ICD-10-CM | POA: Diagnosis not present

## 2019-05-09 DIAGNOSIS — G40901 Epilepsy, unspecified, not intractable, with status epilepticus: Secondary | ICD-10-CM | POA: Diagnosis not present

## 2019-05-09 DIAGNOSIS — J9611 Chronic respiratory failure with hypoxia: Secondary | ICD-10-CM | POA: Diagnosis not present

## 2019-05-09 DIAGNOSIS — R69 Illness, unspecified: Secondary | ICD-10-CM | POA: Diagnosis not present

## 2019-05-10 DIAGNOSIS — G40901 Epilepsy, unspecified, not intractable, with status epilepticus: Secondary | ICD-10-CM | POA: Diagnosis not present

## 2019-05-10 DIAGNOSIS — T8789 Other complications of amputation stump: Secondary | ICD-10-CM | POA: Diagnosis not present

## 2019-05-10 DIAGNOSIS — R69 Illness, unspecified: Secondary | ICD-10-CM | POA: Diagnosis not present

## 2019-05-10 DIAGNOSIS — I10 Essential (primary) hypertension: Secondary | ICD-10-CM | POA: Diagnosis not present

## 2019-05-10 DIAGNOSIS — J9611 Chronic respiratory failure with hypoxia: Secondary | ICD-10-CM | POA: Diagnosis not present

## 2019-05-10 DIAGNOSIS — J449 Chronic obstructive pulmonary disease, unspecified: Secondary | ICD-10-CM | POA: Diagnosis not present

## 2019-05-10 DIAGNOSIS — R7303 Prediabetes: Secondary | ICD-10-CM | POA: Diagnosis not present

## 2019-05-10 DIAGNOSIS — I739 Peripheral vascular disease, unspecified: Secondary | ICD-10-CM | POA: Diagnosis not present

## 2019-05-11 DIAGNOSIS — R69 Illness, unspecified: Secondary | ICD-10-CM | POA: Diagnosis not present

## 2019-05-11 DIAGNOSIS — I10 Essential (primary) hypertension: Secondary | ICD-10-CM | POA: Diagnosis not present

## 2019-05-11 DIAGNOSIS — R7303 Prediabetes: Secondary | ICD-10-CM | POA: Diagnosis not present

## 2019-05-11 DIAGNOSIS — I739 Peripheral vascular disease, unspecified: Secondary | ICD-10-CM | POA: Diagnosis not present

## 2019-05-11 DIAGNOSIS — G40901 Epilepsy, unspecified, not intractable, with status epilepticus: Secondary | ICD-10-CM | POA: Diagnosis not present

## 2019-05-11 DIAGNOSIS — J449 Chronic obstructive pulmonary disease, unspecified: Secondary | ICD-10-CM | POA: Diagnosis not present

## 2019-05-11 DIAGNOSIS — J9611 Chronic respiratory failure with hypoxia: Secondary | ICD-10-CM | POA: Diagnosis not present

## 2019-05-11 DIAGNOSIS — T8789 Other complications of amputation stump: Secondary | ICD-10-CM | POA: Diagnosis not present

## 2019-05-16 DIAGNOSIS — R7303 Prediabetes: Secondary | ICD-10-CM | POA: Diagnosis not present

## 2019-05-16 DIAGNOSIS — R69 Illness, unspecified: Secondary | ICD-10-CM | POA: Diagnosis not present

## 2019-05-16 DIAGNOSIS — J449 Chronic obstructive pulmonary disease, unspecified: Secondary | ICD-10-CM | POA: Diagnosis not present

## 2019-05-16 DIAGNOSIS — J9611 Chronic respiratory failure with hypoxia: Secondary | ICD-10-CM | POA: Diagnosis not present

## 2019-05-16 DIAGNOSIS — T8789 Other complications of amputation stump: Secondary | ICD-10-CM | POA: Diagnosis not present

## 2019-05-16 DIAGNOSIS — I739 Peripheral vascular disease, unspecified: Secondary | ICD-10-CM | POA: Diagnosis not present

## 2019-05-16 DIAGNOSIS — I10 Essential (primary) hypertension: Secondary | ICD-10-CM | POA: Diagnosis not present

## 2019-05-16 DIAGNOSIS — G40901 Epilepsy, unspecified, not intractable, with status epilepticus: Secondary | ICD-10-CM | POA: Diagnosis not present

## 2019-05-18 DIAGNOSIS — J449 Chronic obstructive pulmonary disease, unspecified: Secondary | ICD-10-CM | POA: Diagnosis not present

## 2019-05-18 DIAGNOSIS — J9611 Chronic respiratory failure with hypoxia: Secondary | ICD-10-CM | POA: Diagnosis not present

## 2019-05-18 DIAGNOSIS — R7303 Prediabetes: Secondary | ICD-10-CM | POA: Diagnosis not present

## 2019-05-18 DIAGNOSIS — I10 Essential (primary) hypertension: Secondary | ICD-10-CM | POA: Diagnosis not present

## 2019-05-18 DIAGNOSIS — I739 Peripheral vascular disease, unspecified: Secondary | ICD-10-CM | POA: Diagnosis not present

## 2019-05-18 DIAGNOSIS — T8789 Other complications of amputation stump: Secondary | ICD-10-CM | POA: Diagnosis not present

## 2019-05-18 DIAGNOSIS — R69 Illness, unspecified: Secondary | ICD-10-CM | POA: Diagnosis not present

## 2019-05-18 DIAGNOSIS — G40901 Epilepsy, unspecified, not intractable, with status epilepticus: Secondary | ICD-10-CM | POA: Diagnosis not present

## 2019-05-22 DIAGNOSIS — T8789 Other complications of amputation stump: Secondary | ICD-10-CM | POA: Diagnosis not present

## 2019-05-22 DIAGNOSIS — R7303 Prediabetes: Secondary | ICD-10-CM | POA: Diagnosis not present

## 2019-05-22 DIAGNOSIS — J449 Chronic obstructive pulmonary disease, unspecified: Secondary | ICD-10-CM | POA: Diagnosis not present

## 2019-05-22 DIAGNOSIS — J9611 Chronic respiratory failure with hypoxia: Secondary | ICD-10-CM | POA: Diagnosis not present

## 2019-05-22 DIAGNOSIS — G40901 Epilepsy, unspecified, not intractable, with status epilepticus: Secondary | ICD-10-CM | POA: Diagnosis not present

## 2019-05-22 DIAGNOSIS — I739 Peripheral vascular disease, unspecified: Secondary | ICD-10-CM | POA: Diagnosis not present

## 2019-05-22 DIAGNOSIS — I10 Essential (primary) hypertension: Secondary | ICD-10-CM | POA: Diagnosis not present

## 2019-05-22 DIAGNOSIS — R69 Illness, unspecified: Secondary | ICD-10-CM | POA: Diagnosis not present

## 2019-05-25 DIAGNOSIS — R7303 Prediabetes: Secondary | ICD-10-CM | POA: Diagnosis not present

## 2019-05-25 DIAGNOSIS — I739 Peripheral vascular disease, unspecified: Secondary | ICD-10-CM | POA: Diagnosis not present

## 2019-05-25 DIAGNOSIS — T8789 Other complications of amputation stump: Secondary | ICD-10-CM | POA: Diagnosis not present

## 2019-05-25 DIAGNOSIS — I10 Essential (primary) hypertension: Secondary | ICD-10-CM | POA: Diagnosis not present

## 2019-05-25 DIAGNOSIS — R69 Illness, unspecified: Secondary | ICD-10-CM | POA: Diagnosis not present

## 2019-05-25 DIAGNOSIS — J449 Chronic obstructive pulmonary disease, unspecified: Secondary | ICD-10-CM | POA: Diagnosis not present

## 2019-05-25 DIAGNOSIS — J9611 Chronic respiratory failure with hypoxia: Secondary | ICD-10-CM | POA: Diagnosis not present

## 2019-05-25 DIAGNOSIS — G40901 Epilepsy, unspecified, not intractable, with status epilepticus: Secondary | ICD-10-CM | POA: Diagnosis not present

## 2019-05-28 DIAGNOSIS — R41841 Cognitive communication deficit: Secondary | ICD-10-CM | POA: Diagnosis not present

## 2019-05-28 DIAGNOSIS — M6281 Muscle weakness (generalized): Secondary | ICD-10-CM | POA: Diagnosis not present

## 2019-05-28 DIAGNOSIS — I10 Essential (primary) hypertension: Secondary | ICD-10-CM | POA: Diagnosis not present

## 2019-05-28 DIAGNOSIS — J441 Chronic obstructive pulmonary disease with (acute) exacerbation: Secondary | ICD-10-CM | POA: Diagnosis not present

## 2019-06-01 DIAGNOSIS — R0902 Hypoxemia: Secondary | ICD-10-CM | POA: Diagnosis not present

## 2019-06-01 DIAGNOSIS — R69 Illness, unspecified: Secondary | ICD-10-CM | POA: Diagnosis not present

## 2019-06-28 DIAGNOSIS — I10 Essential (primary) hypertension: Secondary | ICD-10-CM | POA: Diagnosis not present

## 2019-06-28 DIAGNOSIS — J441 Chronic obstructive pulmonary disease with (acute) exacerbation: Secondary | ICD-10-CM | POA: Diagnosis not present

## 2019-06-28 DIAGNOSIS — M6281 Muscle weakness (generalized): Secondary | ICD-10-CM | POA: Diagnosis not present

## 2019-06-28 DIAGNOSIS — R41841 Cognitive communication deficit: Secondary | ICD-10-CM | POA: Diagnosis not present

## 2019-07-02 DIAGNOSIS — R0902 Hypoxemia: Secondary | ICD-10-CM | POA: Diagnosis not present

## 2019-07-16 DIAGNOSIS — Z20828 Contact with and (suspected) exposure to other viral communicable diseases: Secondary | ICD-10-CM | POA: Diagnosis not present

## 2019-07-21 DIAGNOSIS — R69 Illness, unspecified: Secondary | ICD-10-CM | POA: Diagnosis not present

## 2019-07-28 DIAGNOSIS — I10 Essential (primary) hypertension: Secondary | ICD-10-CM | POA: Diagnosis not present

## 2019-07-28 DIAGNOSIS — R41841 Cognitive communication deficit: Secondary | ICD-10-CM | POA: Diagnosis not present

## 2019-07-28 DIAGNOSIS — M6281 Muscle weakness (generalized): Secondary | ICD-10-CM | POA: Diagnosis not present

## 2019-07-28 DIAGNOSIS — J441 Chronic obstructive pulmonary disease with (acute) exacerbation: Secondary | ICD-10-CM | POA: Diagnosis not present

## 2019-08-01 DIAGNOSIS — R0902 Hypoxemia: Secondary | ICD-10-CM | POA: Diagnosis not present

## 2019-08-09 DIAGNOSIS — I6529 Occlusion and stenosis of unspecified carotid artery: Secondary | ICD-10-CM | POA: Diagnosis not present

## 2019-08-28 DIAGNOSIS — I10 Essential (primary) hypertension: Secondary | ICD-10-CM | POA: Diagnosis not present

## 2019-08-28 DIAGNOSIS — R41841 Cognitive communication deficit: Secondary | ICD-10-CM | POA: Diagnosis not present

## 2019-08-28 DIAGNOSIS — J441 Chronic obstructive pulmonary disease with (acute) exacerbation: Secondary | ICD-10-CM | POA: Diagnosis not present

## 2019-08-28 DIAGNOSIS — M6281 Muscle weakness (generalized): Secondary | ICD-10-CM | POA: Diagnosis not present

## 2019-09-01 DIAGNOSIS — D649 Anemia, unspecified: Secondary | ICD-10-CM | POA: Diagnosis not present

## 2019-09-01 DIAGNOSIS — I739 Peripheral vascular disease, unspecified: Secondary | ICD-10-CM | POA: Diagnosis not present

## 2019-09-01 DIAGNOSIS — R3915 Urgency of urination: Secondary | ICD-10-CM | POA: Diagnosis not present

## 2019-09-01 DIAGNOSIS — R0902 Hypoxemia: Secondary | ICD-10-CM | POA: Diagnosis not present

## 2019-09-01 DIAGNOSIS — R918 Other nonspecific abnormal finding of lung field: Secondary | ICD-10-CM | POA: Diagnosis not present

## 2019-09-01 DIAGNOSIS — E559 Vitamin D deficiency, unspecified: Secondary | ICD-10-CM | POA: Diagnosis not present

## 2019-09-01 DIAGNOSIS — R569 Unspecified convulsions: Secondary | ICD-10-CM | POA: Diagnosis not present

## 2019-09-01 DIAGNOSIS — I10 Essential (primary) hypertension: Secondary | ICD-10-CM | POA: Diagnosis not present

## 2019-09-01 DIAGNOSIS — Z720 Tobacco use: Secondary | ICD-10-CM | POA: Diagnosis not present

## 2019-09-01 DIAGNOSIS — R69 Illness, unspecified: Secondary | ICD-10-CM | POA: Diagnosis not present

## 2019-09-27 DIAGNOSIS — I10 Essential (primary) hypertension: Secondary | ICD-10-CM | POA: Diagnosis not present

## 2019-09-27 DIAGNOSIS — J441 Chronic obstructive pulmonary disease with (acute) exacerbation: Secondary | ICD-10-CM | POA: Diagnosis not present

## 2019-09-27 DIAGNOSIS — M6281 Muscle weakness (generalized): Secondary | ICD-10-CM | POA: Diagnosis not present

## 2019-09-27 DIAGNOSIS — R41841 Cognitive communication deficit: Secondary | ICD-10-CM | POA: Diagnosis not present

## 2019-10-01 DIAGNOSIS — R0902 Hypoxemia: Secondary | ICD-10-CM | POA: Diagnosis not present

## 2020-08-09 ENCOUNTER — Ambulatory Visit: Payer: Medicare HMO | Admitting: Internal Medicine

## 2024-07-26 DEATH — deceased
